# Patient Record
Sex: Female | Born: 1980 | Race: White | Hispanic: No | Marital: Married | Smoking: Current every day smoker
Health system: Southern US, Community
[De-identification: ages and names within clinical notes are randomized; demographics above are authoritative.]

## PROBLEM LIST (undated history)

## (undated) DIAGNOSIS — J329 Chronic sinusitis, unspecified: Secondary | ICD-10-CM

## (undated) DIAGNOSIS — N200 Calculus of kidney: Secondary | ICD-10-CM

## (undated) DIAGNOSIS — N949 Unspecified condition associated with female genital organs and menstrual cycle: Secondary | ICD-10-CM

## (undated) DIAGNOSIS — G43109 Migraine with aura, not intractable, without status migrainosus: Secondary | ICD-10-CM

## (undated) DIAGNOSIS — Z5189 Encounter for other specified aftercare: Secondary | ICD-10-CM

## (undated) DIAGNOSIS — J209 Acute bronchitis, unspecified: Secondary | ICD-10-CM

## (undated) DIAGNOSIS — Z803 Family history of malignant neoplasm of breast: Secondary | ICD-10-CM

## (undated) DIAGNOSIS — Z801 Family history of malignant neoplasm of trachea, bronchus and lung: Secondary | ICD-10-CM

## (undated) DIAGNOSIS — D649 Anemia, unspecified: Secondary | ICD-10-CM

## (undated) DIAGNOSIS — K8020 Calculus of gallbladder without cholecystitis without obstruction: Secondary | ICD-10-CM

## (undated) HISTORY — DX: Chronic sinusitis, unspecified: J32.9

## (undated) HISTORY — DX: Family history of malignant neoplasm of trachea, bronchus and lung: Z80.1

## (undated) HISTORY — DX: Acute bronchitis, unspecified: J20.9

## (undated) HISTORY — DX: Anemia, unspecified: D64.9

## (undated) HISTORY — DX: Migraine with aura, not intractable, without status migrainosus: G43.109

## (undated) HISTORY — DX: Calculus of gallbladder without cholecystitis without obstruction: K80.20

## (undated) HISTORY — DX: Family history of malignant neoplasm of breast: Z80.3

## (undated) HISTORY — DX: Unspecified condition associated with female genital organs and menstrual cycle: N94.9

## (undated) HISTORY — DX: Encounter for other specified aftercare: Z51.89

## (undated) HISTORY — DX: Calculus of kidney: N20.0

---

## 2006-09-22 DIAGNOSIS — G43109 Migraine with aura, not intractable, without status migrainosus: Secondary | ICD-10-CM | POA: Insufficient documentation

## 2006-09-22 DIAGNOSIS — N870 Mild cervical dysplasia: Secondary | ICD-10-CM | POA: Insufficient documentation

## 2006-09-22 DIAGNOSIS — Z720 Tobacco use: Secondary | ICD-10-CM | POA: Insufficient documentation

## 2006-09-22 DIAGNOSIS — G43909 Migraine, unspecified, not intractable, without status migrainosus: Secondary | ICD-10-CM | POA: Insufficient documentation

## 2006-09-22 DIAGNOSIS — D508 Other iron deficiency anemias: Secondary | ICD-10-CM | POA: Insufficient documentation

## 2006-09-22 HISTORY — DX: Migraine with aura, not intractable, without status migrainosus: G43.109

## 2006-09-28 DIAGNOSIS — F17200 Nicotine dependence, unspecified, uncomplicated: Secondary | ICD-10-CM | POA: Insufficient documentation

## 2006-10-10 ENCOUNTER — Emergency Department: Payer: Self-pay | Admitting: Unknown Physician Specialty

## 2006-11-26 DIAGNOSIS — N949 Unspecified condition associated with female genital organs and menstrual cycle: Secondary | ICD-10-CM | POA: Insufficient documentation

## 2006-11-26 HISTORY — DX: Unspecified condition associated with female genital organs and menstrual cycle: N94.9

## 2007-02-04 DIAGNOSIS — H109 Unspecified conjunctivitis: Secondary | ICD-10-CM | POA: Insufficient documentation

## 2007-08-18 ENCOUNTER — Emergency Department: Payer: Self-pay | Admitting: Emergency Medicine

## 2007-09-06 DIAGNOSIS — J209 Acute bronchitis, unspecified: Secondary | ICD-10-CM

## 2007-09-06 HISTORY — DX: Acute bronchitis, unspecified: J20.9

## 2007-10-04 DIAGNOSIS — Z3041 Encounter for surveillance of contraceptive pills: Secondary | ICD-10-CM | POA: Insufficient documentation

## 2008-05-22 DIAGNOSIS — Z0289 Encounter for other administrative examinations: Secondary | ICD-10-CM | POA: Insufficient documentation

## 2008-05-30 DIAGNOSIS — R5381 Other malaise: Secondary | ICD-10-CM | POA: Insufficient documentation

## 2008-05-30 DIAGNOSIS — R5383 Other fatigue: Secondary | ICD-10-CM

## 2008-09-06 DIAGNOSIS — J069 Acute upper respiratory infection, unspecified: Secondary | ICD-10-CM | POA: Insufficient documentation

## 2008-09-07 DIAGNOSIS — N390 Urinary tract infection, site not specified: Secondary | ICD-10-CM | POA: Insufficient documentation

## 2008-12-07 DIAGNOSIS — J329 Chronic sinusitis, unspecified: Secondary | ICD-10-CM | POA: Insufficient documentation

## 2008-12-07 HISTORY — DX: Chronic sinusitis, unspecified: J32.9

## 2008-12-31 ENCOUNTER — Ambulatory Visit: Payer: Self-pay | Admitting: Certified Nurse Midwife

## 2009-08-28 ENCOUNTER — Ambulatory Visit: Payer: Self-pay

## 2009-08-29 ENCOUNTER — Inpatient Hospital Stay: Payer: Self-pay

## 2010-05-31 ENCOUNTER — Emergency Department: Payer: Self-pay | Admitting: Internal Medicine

## 2010-11-30 HISTORY — PX: TUBAL LIGATION: SHX77

## 2011-04-18 ENCOUNTER — Observation Stay: Payer: Self-pay

## 2011-06-02 ENCOUNTER — Ambulatory Visit: Payer: Self-pay

## 2011-06-04 ENCOUNTER — Inpatient Hospital Stay: Payer: Self-pay

## 2011-06-05 LAB — PATHOLOGY REPORT

## 2014-05-08 ENCOUNTER — Emergency Department: Payer: Self-pay | Admitting: Emergency Medicine

## 2014-05-08 LAB — URINALYSIS, COMPLETE
Bilirubin,UR: NEGATIVE
Glucose,UR: NEGATIVE mg/dL (ref 0–75)
Nitrite: POSITIVE
Ph: 7 (ref 4.5–8.0)
Protein: NEGATIVE
RBC,UR: 201 /HPF (ref 0–5)
Specific Gravity: 1.015 (ref 1.003–1.030)
Squamous Epithelial: 1
WBC UR: 9 /HPF (ref 0–5)

## 2014-05-08 LAB — BASIC METABOLIC PANEL
Anion Gap: 11 (ref 7–16)
BUN: 13 mg/dL (ref 7–18)
Calcium, Total: 8.8 mg/dL (ref 8.5–10.1)
Chloride: 109 mmol/L — ABNORMAL HIGH (ref 98–107)
Co2: 22 mmol/L (ref 21–32)
Creatinine: 0.92 mg/dL (ref 0.60–1.30)
EGFR (African American): >60
EGFR (Non-African Amer.): >60
Glucose: 125 mg/dL — ABNORMAL HIGH (ref 65–99)
Osmolality: 285 (ref 275–301)
Potassium: 3.2 mmol/L — ABNORMAL LOW (ref 3.5–5.1)
Sodium: 142 mmol/L (ref 136–145)

## 2014-05-08 LAB — CBC
HCT: 37.1 % (ref 35.0–47.0)
HGB: 12.1 g/dL (ref 12.0–16.0)
MCH: 29.1 pg (ref 26.0–34.0)
MCHC: 32.7 g/dL (ref 32.0–36.0)
MCV: 89 fL (ref 80–100)
Platelet: 459 10*3/uL — ABNORMAL HIGH (ref 150–440)
RBC: 4.17 10*6/uL (ref 3.80–5.20)
RDW: 14.2 % (ref 11.5–14.5)
WBC: 17.7 10*3/uL — ABNORMAL HIGH (ref 3.6–11.0)

## 2014-05-08 LAB — HCG, QUANTITATIVE, PREGNANCY: Beta Hcg, Quant.: <1 m[IU]/mL — ABNORMAL LOW

## 2014-10-29 ENCOUNTER — Emergency Department: Payer: Self-pay | Admitting: Emergency Medicine

## 2014-10-29 LAB — URINALYSIS, COMPLETE
Bilirubin,UR: NEGATIVE
Glucose,UR: NEGATIVE mg/dL (ref 0–75)
Ketone: NEGATIVE
Nitrite: NEGATIVE
Ph: 6 (ref 4.5–8.0)
Protein: NEGATIVE
RBC,UR: 4 /HPF (ref 0–5)
Specific Gravity: 1.011 (ref 1.003–1.030)
Squamous Epithelial: 8
Transitional Epi: <1
WBC UR: 18 /HPF (ref 0–5)

## 2014-10-29 LAB — COMPREHENSIVE METABOLIC PANEL
Albumin: 3.9 g/dL (ref 3.4–5.0)
Alkaline Phosphatase: 95 U/L
Anion Gap: 5 — ABNORMAL LOW (ref 7–16)
BUN: 9 mg/dL (ref 7–18)
Bilirubin,Total: 0.5 mg/dL (ref 0.2–1.0)
Calcium, Total: 8.6 mg/dL (ref 8.5–10.1)
Chloride: 103 mmol/L (ref 98–107)
Co2: 29 mmol/L (ref 21–32)
Creatinine: 0.79 mg/dL (ref 0.60–1.30)
EGFR (African American): >60
EGFR (Non-African Amer.): >60
Glucose: 94 mg/dL (ref 65–99)
Osmolality: 272 (ref 275–301)
Potassium: 3.5 mmol/L (ref 3.5–5.1)
SGOT(AST): 16 U/L (ref 15–37)
SGPT (ALT): 27 U/L
Sodium: 137 mmol/L (ref 136–145)
Total Protein: 7.9 g/dL (ref 6.4–8.2)

## 2014-10-29 LAB — CBC
HCT: 40 % (ref 35.0–47.0)
HGB: 13.2 g/dL (ref 12.0–16.0)
MCH: 30.2 pg (ref 26.0–34.0)
MCHC: 33.1 g/dL (ref 32.0–36.0)
MCV: 91 fL (ref 80–100)
Platelet: 369 10*3/uL (ref 150–440)
RBC: 4.38 10*6/uL (ref 3.80–5.20)
RDW: 13.9 % (ref 11.5–14.5)
WBC: 14.2 10*3/uL — ABNORMAL HIGH (ref 3.6–11.0)

## 2015-07-31 ENCOUNTER — Encounter: Payer: Self-pay | Admitting: Emergency Medicine

## 2015-07-31 ENCOUNTER — Ambulatory Visit
Admission: EM | Admit: 2015-07-31 | Discharge: 2015-07-31 | Disposition: A | Discharge status: Home / Self Care | Payer: Self-pay | Attending: Emergency Medicine | Admitting: Emergency Medicine

## 2015-07-31 ENCOUNTER — Ambulatory Visit: Payer: Self-pay

## 2015-07-31 DIAGNOSIS — J189 Pneumonia, unspecified organism: Secondary | ICD-10-CM

## 2015-07-31 LAB — CBC WITH DIFFERENTIAL/PLATELET
Basophils Absolute: 0.1 10*3/uL (ref 0–0.1)
Basophils Relative: 1 %
Eosinophils Absolute: 0.1 10*3/uL (ref 0–0.7)
Eosinophils Relative: 1 %
HCT: 38.1 % (ref 35.0–47.0)
Hemoglobin: 12.7 g/dL (ref 12.0–16.0)
Lymphocytes Relative: 18 %
Lymphs Abs: 1.7 10*3/uL (ref 1.0–3.6)
MCH: 29.1 pg (ref 26.0–34.0)
MCHC: 33.4 g/dL (ref 32.0–36.0)
MCV: 87.2 fL (ref 80.0–100.0)
Monocytes Absolute: 0.6 10*3/uL (ref 0.2–0.9)
Monocytes Relative: 7 %
Neutro Abs: 7 10*3/uL — ABNORMAL HIGH (ref 1.4–6.5)
Neutrophils Relative %: 73 %
Platelets: 259 10*3/uL (ref 150–440)
RBC: 4.37 MIL/uL (ref 3.80–5.20)
RDW: 13.7 % (ref 11.5–14.5)
WBC: 9.5 10*3/uL (ref 3.6–11.0)

## 2015-07-31 LAB — RAPID INFLUENZA A&B ANTIGENS
Influenza A (ARMC): NOT DETECTED
Influenza B (ARMC): NOT DETECTED

## 2015-07-31 MED ORDER — HYDROCOD POLST-CPM POLST ER 10-8 MG/5ML PO SUER: 5.0000 mL | Freq: Two times a day (BID) | ORAL | Status: DC | PRN

## 2015-07-31 MED ORDER — AZITHROMYCIN 250 MG PO TABS: 250.0000 mg | ORAL_TABLET | Freq: Every day | ORAL | Status: DC

## 2015-07-31 MED ORDER — ALBUTEROL SULFATE HFA 108 (90 BASE) MCG/ACT IN AERS: 2.0000 | INHALATION_SPRAY | RESPIRATORY_TRACT | Status: DC | PRN

## 2015-07-31 MED ORDER — IBUPROFEN 800 MG PO TABS: 800.0000 mg | ORAL_TABLET | Freq: Four times a day (QID) | ORAL | Status: AC | PRN

## 2015-07-31 NOTE — Discharge Instructions (Signed)
2 puffs from your albuterol inhaler every 4-6 hours. Ibuprofen as needed for fever and pain. Finish the azithromycin even if you feel better. Cheratussin for severe cough only. Follow-up with your primary care physician or here if not getting better within 36-48 hours. Go to the ER if you get significantly worse, Chest pain, shortness of breath.

## 2015-07-31 NOTE — ED Provider Notes (Signed)
HPI  SUBJECTIVE:  Heather Harrell is a 34 y.o. female who presents with fevers Tmax 102 for 3 days. She reports body aches, fatigue during this time. She reports cough and rhinorrhea starting today. Symptoms are better with ibuprofen 800 mg every 4 hours, no aggravating factors. She has not tried anything else. She reports mild intermittent headache when febrile and some nausea, but denies neck stiffness, ear pain, sore throat, postnasal drip, wheezing, chest pain, shortness of breath. No abdominal pain. No urinary urgency, frequency, cloudy or odorous urine, hematuria. No rash. No sick contacts. She does not taking medications on a regular basis. No tick bite. Patient has a past medical history of nephrolithiasis, no diabetes, hypertension. LMP 2 weeks ago.  History reviewed. No pertinent past medical history.  Past Surgical History  Procedure Laterality Date  . Cesarean section      History reviewed. No pertinent family history.  Social History  Substance Use Topics  . Smoking status: Current Every Day Smoker  . Smokeless tobacco: None  . Alcohol Use: No    No current facility-administered medications for this encounter.  Current outpatient prescriptions:  .  albuterol (PROVENTIL HFA;VENTOLIN HFA) 108 (90 BASE) MCG/ACT inhaler, Inhale 2 puffs into the lungs every 4 (four) hours as needed for wheezing or shortness of breath. Dispense with aerochamber, Disp: 1 Inhaler, Rfl: 0 .  azithromycin (ZITHROMAX) 250 MG tablet, Take 1 tablet (250 mg total) by mouth daily. 2 tabs po on day 1, 1 tab po on days 2-5, Disp: 6 tablet, Rfl: 0 .  chlorpheniramine-HYDROcodone (TUSSIONEX PENNKINETIC ER) 10-8 MG/5ML SUER, Take 5 mLs by mouth every 12 (twelve) hours as needed for cough., Disp: 120 mL, Rfl: 0 .  ibuprofen (ADVIL,MOTRIN) 800 MG tablet, Take 1 tablet (800 mg total) by mouth every 6 (six) hours as needed., Disp: 30 tablet, Rfl: 0  Allergies  Allergen Reactions  . Codeine Itching      ROS  As noted in HPI.   Physical Exam  BP 121/66 mmHg  Pulse 87  Temp(Src) 98.6 F (37 C) (Oral)  Resp 18  Ht 5\' 2"  (1.575 m)  Wt 173 lb (78.472 kg)  BMI 31.63 kg/m2  SpO2 99%  LMP 07/17/2015  Constitutional: Well developed, well nourished, no acute distress Eyes: PERRL, EOMI, conjunctiva normal bilaterally HENT: Normocephalic, atraumatic,mucus membranes moist TMs normal bilaterally, mild nasal congestion, no sinus tenderness, oropharynx normal Respiratory: Clear to auscultation bilaterally, no rales, no wheezing, no rhonchi Cardiovascular: Normal rate and rhythm, no murmurs, no gallops, no rubs GI: Soft, nondistended, normal bowel sounds, nontender, no rebound, no guarding. No splenomegaly Back: no CVAT skin: No rash, skin intact Lymph, no cervical lymphadenopathy. Musculoskeletal: No edema, no tenderness, no deformities Neurologic: Alert & oriented x 3, CN II-XII grossly intact, no motor deficits, sensation grossly intact Psychiatric: Speech and behavior appropriate   ED Course   Medications - No data to display  Orders Placed This Encounter  Procedures  . Influenza A&B Antigens ([place] only)    Standing Status: Standing     Number of Occurrences: 1     Standing Expiration Date:   . DG Chest 2 View    Standing Status: Standing     Number of Occurrences: 1     Standing Expiration Date:     Order Specific Question:  Reason for Exam (SYMPTOM  OR DIAGNOSIS REQUIRED)    Answer:  cough fever  . CBC with Differential    Standing Status: Standing  Number of Occurrences: 1     Standing Expiration Date:    Results for orders placed or performed during the hospital encounter of 07/31/15 (from the past 24 hour(s))  Influenza A&B Antigens ([facility] only)     Status: None   Collection Time: 07/31/15  6:00 PM  Result Value Ref Range   Influenza A [facility]) NOT DETECTED    Influenza B ([facility]) NOT DETECTED   CBC with Differential     Status: Abnormal   Collection Time:  07/31/15  6:00 PM  Result Value Ref Range   WBC 9.5 3.6 - 11.0 K/uL   RBC 4.37 3.80 - 5.20 MIL/uL   Hemoglobin 12.7 12.0 - 16.0 g/dL   HCT 38.1 35.0 - 47.0 %   MCV 87.2 80.0 - 100.0 fL   MCH 29.1 26.0 - 34.0 pg   MCHC 33.4 32.0 - 36.0 g/dL   RDW 13.7 11.5 - 14.5 %   Platelets 259 150 - 440 K/uL   Neutrophils Relative % 73 %   Neutro Abs 7.0 (H) 1.4 - 6.5 K/uL   Lymphocytes Relative 18 %   Lymphs Abs 1.7 1.0 - 3.6 K/uL   Monocytes Relative 7 %   Monocytes Absolute 0.6 0.2 - 0.9 K/uL   Eosinophils Relative 1 %   Eosinophils Absolute 0.1 0 - 0.7 K/uL   Basophils Relative 1 %   Basophils Absolute 0.1 0 - 0.1 K/uL   Dg Chest 2 View  07/31/2015   CLINICAL DATA:  Nonproductive cough and fever for 4 days.  EXAM: CHEST  2 VIEW  COMPARISON:  None.  FINDINGS: The heart size and mediastinal contours are within normal limits.  Pulmonary infiltrate is seen in the right lower lobe partially silhouetting the and right hemidiaphragm. Left lung remains clear. No evidence of pleural effusion.  IMPRESSION: Right lower lobe infiltrate, consistent with pneumonia. No evidence of pleural effusion.   Electronically Signed   By: Earle Gell M.D.   On: 07/31/2015 18:12    ED Clinical Impression  Community acquired pneumonia  ED Assessment/Plan  Checking flu, chest x-ray due to cough and fever to rule out pneumonia. Also checking a CBC. Patient has no urinary complaints, will not do a UA. LMP was 2 weeks ago. No evidence of otitis, meningitis, pharyngitis, sinusitis, intra-abdominal process, RMSF, Tick borne disease.   Reviewed labs, imaging independently. RLL PNA. Flu negative. No leukocytosis. See radiology report for full details. Albuterol Cheratussin ibuprofen increase fluids azithromycin. Follow up with primary care physician in [facility] or Dr. Princella Ion she has an appointment there in September 19. Or she can come back here if not getting better in 36-48 hours. Discussed labs,  imaging, MDM, plan and followup with patient . Discussed sn/sx that should prompt return to the UC or ED. Patient  agrees with plan.   *This clinic note was created using Dragon dictation software. Therefore, there may be occasional mistakes despite careful proofreading.  ?   Melynda Ripple, MD 07/31/15 [phone]

## 2015-07-31 NOTE — ED Notes (Signed)
Pt with fever bodyaches tired x 2 days

## 2015-11-01 ENCOUNTER — Ambulatory Visit
Admission: EM | Admit: 2015-11-01 | Discharge: 2015-11-01 | Disposition: A | Discharge status: Home / Self Care | Payer: BLUE CROSS/BLUE SHIELD | Attending: Family Medicine | Admitting: Family Medicine

## 2015-11-01 ENCOUNTER — Encounter: Payer: Self-pay | Admitting: *Deleted

## 2015-11-01 DIAGNOSIS — N390 Urinary tract infection, site not specified: Secondary | ICD-10-CM

## 2015-11-01 LAB — URINALYSIS COMPLETE WITH MICROSCOPIC (ARMC ONLY)
Bilirubin Urine: NEGATIVE
Glucose, UA: NEGATIVE mg/dL
Ketones, ur: NEGATIVE mg/dL
Leukocytes, UA: NEGATIVE
Nitrite: NEGATIVE
Protein, ur: NEGATIVE mg/dL
Specific Gravity, Urine: 1.015 (ref 1.005–1.030)
pH: 7 (ref 5.0–8.0)

## 2015-11-01 LAB — PREGNANCY, URINE: Preg Test, Ur: NEGATIVE

## 2015-11-01 MED ORDER — NITROFURANTOIN MONOHYD MACRO 100 MG PO CAPS: 100.0000 mg | ORAL_CAPSULE | Freq: Two times a day (BID) | ORAL | Status: DC

## 2015-11-01 NOTE — Discharge Instructions (Signed)

## 2015-11-01 NOTE — ED Provider Notes (Signed)
[facility]  ____________________________________________  Time seen: Approximately 1:32 PM  I have reviewed the triage vital signs and the nursing notes.   HISTORY  Chief Complaint Urinary Tract Infection   HPI Heather Harrell is a 34 y.o. female presents for complaints of two days of low back aching pain as well as urinary frequency and burning with urination. States low back pain is mild and aching at 3/10. Denies pain changes with position changes. Denies pain radiation. States feels like she frequently needs to urinate. Denies vaginal bleeding, vaginal discharge, or vaginal complaints. Denies recent changes in sexual partners.   Reports history of UTIs with last being one year ago, and reports feels same. Denies recent antibiotic use. Denies nausea, vomiting, diarrhea, or fever.    LMP: 2 weeks ago, denies chance of pregnancy.   History reviewed. No pertinent past medical history.  There are no active problems to display for this patient.   Past Surgical History  Procedure Laterality Date  . Cesarean section      Current Outpatient Rx  Name  Route  Sig  Dispense  Refill  .           .           .           .             Allergies Ivp dye and Codeine  History reviewed. No pertinent family history.  Social History Social History  Substance Use Topics  . Smoking status: Current Every Day Smoker  . Smokeless tobacco: Never Used  . Alcohol Use: No    Review of Systems Constitutional: No fever/chills Eyes: No visual changes. ENT: No sore throat. Cardiovascular: Denies chest pain. Respiratory: Denies shortness of breath. Gastrointestinal: No abdominal pain.  No nausea, no vomiting.  No diarrhea.  No constipation. Genitourinary: positive for dysuria. Musculoskeletal: Negative for back pain. Skin: Negative for rash. Neurological: Negative for headaches, focal weakness or numbness.  10-point ROS otherwise  negative.  ____________________________________________   PHYSICAL EXAM:  VITAL SIGNS: ED Triage Vitals  Enc Vitals Group     BP 11/01/15 1251 124/80 mmHg     Pulse Rate 11/01/15 1251 95     Resp 11/01/15 1251 18     Temp 11/01/15 1251 98.4 F (36.9 C)     Temp Source 11/01/15 1251 Oral     SpO2 11/01/15 1251 100 %     Weight 11/01/15 1251 169 lb (76.658 kg)     Height 11/01/15 1251 5\' 3"  (1.6 m)     Head Cir --      Peak Flow --      Pain Score 11/01/15 1255 5     Pain Loc --      Pain Edu? --      Excl. in [place]? --     Constitutional: Alert and oriented. Well appearing and in no acute distress. Eyes: Conjunctivae are normal. PERRL. EOMI. Head: Atraumatic.  Nose: No congestion/rhinnorhea.  Mouth/Throat: Mucous membranes are moist.  Oropharynx non-erythematous. Neck: No stridor.  No cervical spine tenderness to palpation. Hematological/Lymphatic/Immunilogical: No cervical lymphadenopathy. Cardiovascular: Normal rate, regular rhythm. Grossly normal heart sounds.  Good peripheral circulation. Respiratory: Normal respiratory effort.  No retractions. Lungs CTAB.No wheezes, rales or rhonchi.  Gastrointestinal: Soft and nontender. No distention. Normal Bowel sounds. No CVA tenderness. Musculoskeletal: No lower or upper extremity tenderness nor edema.  Neurologic:  Normal speech and language. No gross focal neurologic deficits are  appreciated. No gait instability. Skin:  Skin is warm, dry and intact. No rash noted. Psychiatric: Mood and affect are normal. Speech and behavior are normal.  ____________________________________________   LABS (all labs ordered are listed, but only abnormal results are displayed)  Labs Reviewed  URINALYSIS COMPLETEWITH MICROSCOPIC ([place]) - Abnormal; Notable for the following:    Hgb urine dipstick 1+ (*)    Bacteria, UA FEW (*)    Squamous Epithelial / LPF 0-5 (*)    All other components within normal limits  PREGNANCY, URINE      INITIAL IMPRESSION / ASSESSMENT AND PLAN / ED COURSE  Pertinent labs & imaging results that were available during my care of the patient were reviewed by me and considered in my medical decision making (see chart for details).  Well appearing, no acute distress. Dysuria x 2 days. Few bacteria present, with 1+hgb, will culture. And treat UTI with oral macrobid. Supportive treatments, rest, fluids and PCP follow up.   Discussed follow up with Primary care physician this week. Discussed follow up and return parameters including no resolution or any worsening concerns. Patient verbalized understanding and agreed to plan.   ____________________________________________   FINAL CLINICAL IMPRESSION(S) / ED DIAGNOSES  Final diagnoses:  UTI (lower urinary tract infection)       Marylene Land, NP 11/01/15 1705

## 2015-11-01 NOTE — ED Notes (Signed)
Patient started having lower back pain radiating to the front pelvic region. Patient does not report dark urine with odor. Patient does have a history of kidney stones and had her last  UTI 05/2014.

## 2016-02-25 ENCOUNTER — Emergency Department
Admission: EM | Admit: 2016-02-25 | Discharge: 2016-02-25 | Disposition: A | Discharge status: Home / Self Care | Payer: Self-pay | Attending: Emergency Medicine | Admitting: Emergency Medicine

## 2016-02-25 ENCOUNTER — Emergency Department: Payer: Self-pay

## 2016-02-25 DIAGNOSIS — Z79899 Other long term (current) drug therapy: Secondary | ICD-10-CM | POA: Insufficient documentation

## 2016-02-25 DIAGNOSIS — K8020 Calculus of gallbladder without cholecystitis without obstruction: Secondary | ICD-10-CM | POA: Insufficient documentation

## 2016-02-25 DIAGNOSIS — F17210 Nicotine dependence, cigarettes, uncomplicated: Secondary | ICD-10-CM | POA: Insufficient documentation

## 2016-02-25 DIAGNOSIS — J329 Chronic sinusitis, unspecified: Secondary | ICD-10-CM | POA: Insufficient documentation

## 2016-02-25 DIAGNOSIS — N200 Calculus of kidney: Secondary | ICD-10-CM | POA: Insufficient documentation

## 2016-02-25 LAB — URINALYSIS COMPLETE WITH MICROSCOPIC (ARMC ONLY)
Bilirubin Urine: NEGATIVE
Glucose, UA: NEGATIVE mg/dL
Ketones, ur: NEGATIVE mg/dL
Leukocytes, UA: NEGATIVE
Nitrite: NEGATIVE
Protein, ur: 30 mg/dL — AB
Specific Gravity, Urine: 1.016 (ref 1.005–1.030)
pH: 8 (ref 5.0–8.0)

## 2016-02-25 LAB — PREGNANCY, URINE: Preg Test, Ur: NEGATIVE

## 2016-02-25 MED ORDER — ONDANSETRON 4 MG PO TBDP: 4.0000 mg | ORAL_TABLET | Freq: Three times a day (TID) | ORAL | Status: DC | PRN

## 2016-02-25 MED ORDER — SODIUM CHLORIDE 0.9 % IV SOLN
Freq: Once | INTRAVENOUS | Status: AC
  Administered 2016-02-25: 06:00:00 via INTRAVENOUS

## 2016-02-25 MED ORDER — ONDANSETRON HCL 4 MG/2ML IJ SOLN
INTRAMUSCULAR | Status: AC
  Filled 2016-02-25: qty 2

## 2016-02-25 MED ORDER — ONDANSETRON HCL 4 MG/2ML IJ SOLN
4.0000 mg | Freq: Once | INTRAMUSCULAR | Status: AC
  Administered 2016-02-25: 4 mg via INTRAVENOUS

## 2016-02-25 MED ORDER — OXYCODONE-ACETAMINOPHEN 5-325 MG PO TABS
2.0000 | ORAL_TABLET | Freq: Once | ORAL | Status: AC
  Administered 2016-02-25: 2 via ORAL

## 2016-02-25 MED ORDER — OXYCODONE-ACETAMINOPHEN 5-325 MG PO TABS
ORAL_TABLET | ORAL | Status: AC
  Filled 2016-02-25: qty 2

## 2016-02-25 MED ORDER — ONDANSETRON HCL 4 MG/2ML IJ SOLN
INTRAMUSCULAR | Status: AC
  Administered 2016-02-25: 4 mg
  Filled 2016-02-25: qty 2

## 2016-02-25 MED ORDER — MORPHINE SULFATE (PF) 4 MG/ML IV SOLN
INTRAVENOUS | Status: AC
  Administered 2016-02-25: 4 mg
  Filled 2016-02-25: qty 1

## 2016-02-25 MED ORDER — OXYCODONE-ACETAMINOPHEN 5-325 MG PO TABS: 1.0000 | ORAL_TABLET | ORAL | Status: DC | PRN

## 2016-02-25 NOTE — Discharge Instructions (Signed)
Cholelithiasis °Cholelithiasis (also called gallstones) is a form of gallbladder disease in which gallstones form in your gallbladder. The gallbladder is an organ that stores bile made in the liver, which helps digest fats. Gallstones begin as small crystals and slowly grow into stones. Gallstone pain occurs when the gallbladder spasms and a gallstone is blocking the duct. Pain can also occur when a stone passes out of the duct.  °RISK FACTORS °· Being female.   °· Having multiple pregnancies. Health care providers sometimes advise removing diseased gallbladders before future pregnancies.   °· Being obese. °· Eating a diet heavy in fried foods and fat.   °· Being older than 60 years and increasing age.   °· Prolonged use of medicines containing female hormones.   °· Having diabetes mellitus.   °· Rapidly losing weight.   °· Having a family history of gallstones (heredity).   °SYMPTOMS °· Nausea.   °· Vomiting. °· Abdominal pain.   °· Yellowing of the skin (jaundice).   °· Sudden pain. It may persist from several minutes to several hours. °· Fever.   °· Tenderness to the touch.  °In some cases, when gallstones do not move into the bile duct, people have no pain or symptoms. These are called "silent" gallstones.  °TREATMENT °Silent gallstones do not need treatment. In severe cases, emergency surgery may be required. Options for treatment include: °· Surgery to remove the gallbladder. This is the most common treatment. °· Medicines. These do not always work and may take 6-12 months or more to work. °· Shock wave treatment (extracorporeal biliary lithotripsy). In this treatment an ultrasound machine sends shock waves to the gallbladder to break gallstones into smaller pieces that can pass into the intestines or be dissolved by medicine. °HOME CARE INSTRUCTIONS  °· Only take over-the-counter or prescription medicines for pain, discomfort, or fever as directed by your health care provider.   °· Follow a low-fat diet until  seen again by your health care provider. Fat causes the gallbladder to contract, which can result in pain.   °· Follow up with your health care provider as directed. Attacks are almost always recurrent and surgery is usually required for permanent treatment.   °SEEK IMMEDIATE MEDICAL CARE IF:  °· Your pain increases and is not controlled by medicines.   °· You have a fever or persistent symptoms for more than 2-3 days.   °· You have a fever and your symptoms suddenly get worse.   °· You have persistent nausea and vomiting.   °MAKE SURE YOU:  °· Understand these instructions. °· Will watch your condition. °· Will get help right away if you are not doing well or get worse. °  °This information is not intended to replace advice given to you by your health care provider. Make sure you discuss any questions you have with your health care provider. °  °Document Released: 11/12/2005 Document Revised: 07/19/2013 Document Reviewed: 05/10/2013 °Elsevier Interactive Patient Education ©2016 [employer] ° °Kidney Stones °Kidney stones (urolithiasis) are deposits that form inside your kidneys. The intense pain is caused by the stone moving through the urinary tract. When the stone moves, the ureter goes into spasm around the stone. The stone is usually passed in the urine.  °CAUSES  °· A disorder that makes certain neck glands produce too much parathyroid hormone (primary hyperparathyroidism). °· A buildup of uric acid crystals, similar to gout in your joints. °· Narrowing (stricture) of the ureter. °· A kidney obstruction present at birth (congenital obstruction). °· Previous surgery on the kidney or ureters. °· Numerous kidney infections. °SYMPTOMS  °· Feeling sick   to your stomach (nauseous). °· Throwing up (vomiting). °· Blood in the urine (hematuria). °· Pain that usually spreads (radiates) to the groin. °· Frequency or urgency of urination. °DIAGNOSIS  °· Taking a history and physical exam. °· Blood or urine tests. °· CT  scan. °· Occasionally, an examination of the inside of the urinary bladder (cystoscopy) is performed. °TREATMENT  °· Observation. °· Increasing your fluid intake. °· Extracorporeal shock wave lithotripsy--This is a noninvasive procedure that uses shock waves to break up kidney stones. °· Surgery may be needed if you have severe pain or persistent obstruction. There are various surgical procedures. Most of the procedures are performed with the use of small instruments. Only small incisions are needed to accommodate these instruments, so recovery time is minimized. °The size, location, and chemical composition are all important variables that will determine the proper choice of action for you. Talk to your health care provider to better understand your situation so that you will minimize the risk of injury to yourself and your kidney.  °HOME CARE INSTRUCTIONS  °· Drink enough water and fluids to keep your urine clear or pale yellow. This will help you to pass the stone or stone fragments. °· Strain all urine through the provided strainer. Keep all particulate matter and stones for your health care provider to see. The stone causing the pain may be as small as a grain of salt. It is very important to use the strainer each and every time you pass your urine. The collection of your stone will allow your health care provider to analyze it and verify that a stone has actually passed. The stone analysis will often identify what you can do to reduce the incidence of recurrences. °· Only take over-the-counter or prescription medicines for pain, discomfort, or fever as directed by your health care provider. °· Keep all follow-up visits as told by your health care provider. This is important. °· Get follow-up X-rays if required. The absence of pain does not always mean that the stone has passed. It may have only stopped moving. If the urine remains completely obstructed, it can cause loss of kidney function or even complete  destruction of the kidney. It is your responsibility to make sure X-rays and follow-ups are completed. Ultrasounds of the kidney can show blockages and the status of the kidney. Ultrasounds are not associated with any radiation and can be performed easily in a matter of minutes. °· Make changes to your daily diet as told by your health care provider. You may be told to: °¨ Limit the amount of salt that you eat. °¨ Eat 5 or more servings of fruits and vegetables each day. °¨ Limit the amount of meat, poultry, fish, and eggs that you eat. °· Collect a 24-hour urine sample as told by your health care provider. You may need to collect another urine sample every 6-12 months. °SEEK MEDICAL CARE IF: °· You experience pain that is progressive and unresponsive to any pain medicine you have been prescribed. °SEEK IMMEDIATE MEDICAL CARE IF:  °· Pain cannot be controlled with the prescribed medicine. °· You have a fever or shaking chills. °· The severity or intensity of pain increases over 18 hours and is not relieved by pain medicine. °· You develop a new onset of abdominal pain. °· You feel faint or pass out. °· You are unable to urinate. °  °This information is not intended to replace advice given to you by your health care provider. Make sure you discuss   any questions you have with your health care provider. °  °Document Released: 11/16/2005 Document Revised: 08/07/2015 Document Reviewed: 04/19/2013 °Elsevier Interactive Patient Education ©2016 [employer] Inc[employer] ° °

## 2016-02-25 NOTE — ED Notes (Signed)
Pt reports that she was seen in June of last year for a "kidney stone" on the right side and that this pain feels the same except on the left side - She feels like she has to void constantly and the pain radiates from left lower back into left lower abd

## 2016-02-25 NOTE — ED Provider Notes (Signed)
[facility] Emergency Department Provider Note  ____________________________________________  Time seen: 5:30 AM I have reviewed the triage vital signs and the nursing notes.   HISTORY  Chief Complaint Flank Pain     HPI Heather Harrell is a 35 y.o. female presents with 10 out of 10 left flank pain with acute onset at midnight. Patient admits to a history of kidney stone in the past on her right side in 2015. Patient admits to nonbloody emesis. Patient denies any fever. She denies any aggravating or alleviating factors.     Past medical history Kidney stone There are no active problems to display for this patient.   Past Surgical History  Procedure Laterality Date  . Cesarean section      Current Outpatient Rx  Name  Route  Sig  Dispense  Refill  . albuterol (PROVENTIL HFA;VENTOLIN HFA) 108 (90 BASE) MCG/ACT inhaler   Inhalation   Inhale 2 puffs into the lungs every 4 (four) hours as needed for wheezing or shortness of breath. Dispense with aerochamber   1 Inhaler   0   . azithromycin (ZITHROMAX) 250 MG tablet   Oral   Take 1 tablet (250 mg total) by mouth daily. 2 tabs po on day 1, 1 tab po on days 2-5   6 tablet   0   . chlorpheniramine-HYDROcodone (TUSSIONEX PENNKINETIC ER) 10-8 MG/5ML SUER   Oral   Take 5 mLs by mouth every 12 (twelve) hours as needed for cough.   120 mL   0   . ibuprofen (ADVIL,MOTRIN) 800 MG tablet   Oral   Take 1 tablet (800 mg total) by mouth every 6 (six) hours as needed.   30 tablet   0   . nitrofurantoin, macrocrystal-monohydrate, (MACROBID) 100 MG capsule   Oral   Take 1 capsule (100 mg total) by mouth 2 (two) times daily.   10 capsule   0     Allergies Ivp dye  No family history on file.  Social History Social History  Substance Use Topics  . Smoking status: Current Every Day Smoker  . Smokeless tobacco: Never Used  . Alcohol Use: No    Review of Systems  Constitutional: Negative  for fever. Eyes: Negative for visual changes. ENT: Negative for sore throat. Cardiovascular: Negative for chest pain. Respiratory: Negative for shortness of breath. Gastrointestinal: Positive for left flank pain and vomiting Genitourinary: Negative for dysuria. Musculoskeletal: Negative for back pain. Skin: Negative for rash. Neurological: Negative for headaches, focal weakness or numbness.   10-point ROS otherwise negative.  ____________________________________________   PHYSICAL EXAM:  VITAL SIGNS: ED Triage Vitals  Enc Vitals Group     BP 02/25/16 0500 124/84 mmHg     Pulse Rate 02/25/16 0500 83     Resp 02/25/16 0500 18     Temp 02/25/16 0500 97.7 F (36.5 C)     Temp Source 02/25/16 0500 Oral     SpO2 02/25/16 0500 100 %     Weight 02/25/16 0500 168 lb (76.204 kg)     Height 02/25/16 0500 5\' 3"  (1.6 m)     Head Cir --      Peak Flow --      Pain Score 02/25/16 0501 10     Pain Loc --      Pain Edu? --      Excl. in [place]? --     Constitutional: Alert and oriented. Apparent discomfort Eyes: Conjunctivae are normal. PERRL. Normal extraocular movements.  ENT   Head: Normocephalic and atraumatic.   Nose: No congestion/rhinnorhea.   Mouth/Throat: Mucous membranes are moist.   Neck: No stridor. Hematological/Lymphatic/Immunilogical: No cervical lymphadenopathy. Cardiovascular: Normal rate, regular rhythm. Normal and symmetric distal pulses are present in all extremities. No murmurs, rubs, or gallops. Respiratory: Normal respiratory effort without tachypnea nor retractions. Breath sounds are clear and equal bilaterally. No wheezes/rales/rhonchi. Gastrointestinal: Soft and nontender. No distention. There is no CVA tenderness. Genitourinary: deferred Musculoskeletal: Nontender with normal range of motion in all extremities. No joint effusions.  No lower extremity tenderness nor edema. Neurologic:  Normal speech and language. No gross focal neurologic deficits  are appreciated. Speech is normal.  Skin:  Skin is warm, dry and intact. No rash noted. Psychiatric: Mood and affect are normal. Speech and behavior are normal. Patient exhibits appropriate insight and judgment.  ____________________________________________    LABS (pertinent positives/negatives)  Labs Reviewed  URINALYSIS COMPLETEWITH MICROSCOPIC ([place] ONLY) - Abnormal; Notable for the following:    Color, Urine YELLOW (*)    APPearance HAZY (*)    Hgb urine dipstick 3+ (*)    Protein, ur 30 (*)    Bacteria, UA RARE (*)    Squamous Epithelial / LPF 6-30 (*)    All other components within normal limits  PREGNANCY, URINE         INITIAL IMPRESSION / ASSESSMENT AND PLAN / ED COURSE  Pertinent labs & imaging results that were available during my care of the patient were reviewed by me and considered in my medical decision making (see chart for details).  Patient received IV morphine 4 mg and Zofran for analgesia and antiemetic respectively.  ____________________________________________   FINAL CLINICAL IMPRESSION(S) / ED DIAGNOSES  Final diagnoses:  Kidney stone on left side  Gallstone      Gregor Hams, MD 02/28/16 [phone]

## 2016-02-25 NOTE — ED Notes (Signed)
Pt in with co left flank pain since 0000 hx of kidney stones.  Has had vomiting and urinary frequency.

## 2016-02-26 ENCOUNTER — Encounter: Payer: Self-pay | Admitting: Urology

## 2016-02-26 ENCOUNTER — Other Ambulatory Visit: Payer: Self-pay

## 2016-02-26 ENCOUNTER — Ambulatory Visit (INDEPENDENT_AMBULATORY_CARE_PROVIDER_SITE_OTHER): Payer: BLUE CROSS/BLUE SHIELD | Admitting: Urology

## 2016-02-26 VITALS — BP 107/73 | HR 105 | Temp 98.5°F | Ht 63.0 in | Wt 171.8 lb

## 2016-02-26 DIAGNOSIS — IMO0001 Reserved for inherently not codable concepts without codable children: Secondary | ICD-10-CM | POA: Insufficient documentation

## 2016-02-26 DIAGNOSIS — G4733 Obstructive sleep apnea (adult) (pediatric): Secondary | ICD-10-CM | POA: Insufficient documentation

## 2016-02-26 DIAGNOSIS — N200 Calculus of kidney: Secondary | ICD-10-CM | POA: Insufficient documentation

## 2016-02-26 DIAGNOSIS — K8020 Calculus of gallbladder without cholecystitis without obstruction: Secondary | ICD-10-CM | POA: Insufficient documentation

## 2016-02-26 DIAGNOSIS — N390 Urinary tract infection, site not specified: Secondary | ICD-10-CM | POA: Insufficient documentation

## 2016-02-26 HISTORY — DX: Calculus of gallbladder without cholecystitis without obstruction: K80.20

## 2016-02-26 HISTORY — DX: Calculus of kidney: N20.0

## 2016-02-26 LAB — URINALYSIS, COMPLETE
Bilirubin, UA: NEGATIVE
Glucose, UA: NEGATIVE
Nitrite, UA: POSITIVE — AB
Specific Gravity, UA: 1.025 (ref 1.005–1.030)
Urobilinogen, Ur: 0.2 mg/dL (ref 0.2–1.0)
pH, UA: 5.5 (ref 5.0–7.5)

## 2016-02-26 LAB — MICROSCOPIC EXAMINATION
RBC, UA: >30 /hpf — ABNORMAL HIGH (ref 0–?)
WBC, UA: >30 /hpf — ABNORMAL HIGH (ref 0–?)

## 2016-02-26 MED ORDER — CIPROFLOXACIN HCL 500 MG PO TABS: 500.0000 mg | ORAL_TABLET | Freq: Two times a day (BID) | ORAL | Status: DC

## 2016-02-26 NOTE — Progress Notes (Signed)
02/26/2016 1:43 PM   ASHLEYN AGNE 1980/12/02 [id number]  Referring provider: [address] [place], [place] [address]  Chief Complaint  Patient presents with  . New Patient (Initial Visit)    renal stones    HPI: The patient is a 35 year old female with a distal left 5 mm stone at the UVJ.  Upon her arrival to our office today, she passed her stone while giving a urine specimen. She's had nausea and vomiting as well as pain. She was afebrile until earlier this morning when she's by Dr. temperature 102. Her urinalysis is concerning for infection. On review of her CT scan, she only had the 1 stone at the distal UVJ. She has some small calcifications throughout her bilateral kidneys but no other definitive stones. This is her second episode of nephrolithiasis. She passed a stone spontaneously in 2015.   PMH: Past Medical History  Diagnosis Date  . Acute onset aura migraine 09/22/2006  . Acute bronchitis 09/06/2007  . Chronic infection of sinus 12/07/2008  . Biliary calculi 02/26/2016  . Calculus of kidney 02/26/2016  . Female genital symptoms 11/26/2006    ASSESSMENT: Assume UTI as etiology of Sx. Pain in LLQ is less today. If pain persist or does not improve on antibiotic, will proceed with pelvic US to R/O ovarian cyst. Instructed pt that if pain fot worse or she developed fever to report to ER. Pt verb understanding.     Surgical History: Past Surgical History  Procedure Laterality Date  . Cesarean section      Home Medications:    Medication List       This list is accurate as of: 02/26/16  1:43 PM.  Always use your most recent med list.               albuterol 108 (90 Base) MCG/ACT inhaler  Commonly known as:  PROVENTIL HFA;VENTOLIN HFA  Inhale 2 puffs into the lungs every 4 (four) hours as needed for wheezing or shortness of breath. Dispense with aerochamber     azithromycin 250 MG tablet  Commonly known as:   ZITHROMAX  Take 1 tablet (250 mg total) by mouth daily. 2 tabs po on day 1, 1 tab po on days 2-5     chlorpheniramine-HYDROcodone 10-8 MG/5ML Suer  Commonly known as:  TUSSIONEX PENNKINETIC ER  Take 5 mLs by mouth every 12 (twelve) hours as needed for cough.     ciprofloxacin 500 MG tablet  Commonly known as:  CIPRO  Take by mouth. Reported on 02/26/2016     ciprofloxacin 500 MG tablet  Commonly known as:  CIPRO  Take 1 tablet (500 mg total) by mouth every 12 (twelve) hours.     citalopram 20 MG tablet  Commonly known as:  CELEXA  Take 20 mg by mouth daily.     ibuprofen 800 MG tablet  Commonly known as:  ADVIL,MOTRIN  Take 1 tablet (800 mg total) by mouth every 6 (six) hours as needed.     nitrofurantoin (macrocrystal-monohydrate) 100 MG capsule  Commonly known as:  MACROBID  Take 1 capsule (100 mg total) by mouth 2 (two) times daily.     ondansetron 4 MG disintegrating tablet  Commonly known as:  ZOFRAN ODT  Take 1 tablet (4 mg total) by mouth every 8 (eight) hours as needed for nausea or vomiting.     oxyCODONE-acetaminophen 5-325 MG tablet  Commonly known as:  PERCOCET/ROXICET  Take by mouth.  oxyCODONE-acetaminophen 5-325 MG tablet  Commonly known as:  ROXICET  Take 1 tablet by mouth every 4 (four) hours as needed for severe pain.        Allergies:  Allergies  Allergen Reactions  . Ivp Dye [Iodinated Diagnostic Agents] Itching  . Codeine Itching and Hives    Family History: No family history on file.  Social History:  reports that she has been smoking Cigarettes.  She has been smoking about 1.00 pack per day. She has never used smokeless tobacco. She reports that she does not drink alcohol or use illicit drugs.  ROS: UROLOGY Frequent Urination?: Yes Hard to postpone urination?: No Burning/pain with urination?: Yes Get up at night to urinate?: Yes Leakage of urine?: No Urine stream starts and stops?: No Trouble starting stream?: No Do you have to  strain to urinate?: No Blood in urine?: Yes Urinary tract infection?: No Sexually transmitted disease?: No Injury to kidneys or bladder?: No Painful intercourse?: No Weak stream?: No Currently pregnant?: No Vaginal bleeding?: No Last menstrual period?: 02/19/16                                      Physical Exam: BP 107/73 mmHg  Pulse 105  Temp(Src) 98.5 F (36.9 C) (Oral)  Ht 5\' 3"  (1.6 m)  Wt 171 lb 12.8 oz (77.928 kg)  BMI 30.44 kg/m2  LMP 02/19/2016  Constitutional:  Alert and oriented, No acute distress. HEENT: [place] AT, moist mucus membranes.  Trachea midline, no masses. Cardiovascular: No clubbing, cyanosis, or edema. Respiratory: Normal respiratory effort, no increased work of breathing. GI: Abdomen is soft, nontender, nondistended, no abdominal masses GU: Mild left CVA tenderness Skin: No rashes, bruises or suspicious lesions. Lymph: No cervical or inguinal adenopathy. Neurologic: Grossly intact, no focal deficits, moving all 4 extremities. Psychiatric: Normal mood and affect.  Laboratory Data: Lab Results  Component Value Date   WBC 9.5 07/31/2015   HGB 12.7 07/31/2015   HCT 38.1 07/31/2015   MCV 87.2 07/31/2015   PLT 259 07/31/2015    Lab Results  Component Value Date   CREATININE 0.79 10/29/2014    No results found for: PSA  No results found for: TESTOSTERONE  No results found for: HGBA1C  Urinalysis    Component Value Date/Time   COLORURINE YELLOW* 02/25/2016 0505   COLORURINE Yellow 10/29/2014 1702   APPEARANCEUR HAZY* 02/25/2016 0505   APPEARANCEUR Cloudy 10/29/2014 1702   LABSPEC 1.016 02/25/2016 0505   LABSPEC 1.011 10/29/2014 1702   PHURINE 8.0 02/25/2016 0505   PHURINE 6.0 10/29/2014 1702   GLUCOSEU NEGATIVE 02/25/2016 0505   GLUCOSEU Negative 10/29/2014 1702   HGBUR 3+* 02/25/2016 0505   HGBUR 3+ 10/29/2014 1702   BILIRUBINUR NEGATIVE 02/25/2016 0505   BILIRUBINUR Negative 10/29/2014 1702   KETONESUR NEGATIVE  02/25/2016 0505   KETONESUR Negative 10/29/2014 1702   PROTEINUR 30* 02/25/2016 0505   PROTEINUR Negative 10/29/2014 1702   NITRITE NEGATIVE 02/25/2016 0505   NITRITE Negative 10/29/2014 1702   LEUKOCYTESUR NEGATIVE 02/25/2016 0505   LEUKOCYTESUR Trace 10/29/2014 1702    Pertinent Imaging: CLINICAL DATA: Acute onset of increased urinary frequency. Vomiting and left flank pain. Initial encounter.  EXAM: CT ABDOMEN AND PELVIS WITHOUT CONTRAST  TECHNIQUE: Multidetector CT imaging of the abdomen and pelvis was performed following the standard protocol without IV contrast.  COMPARISON: CT of the abdomen and pelvis from 10/29/2014  FINDINGS: The visualized  lung bases are clear. A small to moderate hiatal hernia is noted.  The liver and spleen are unremarkable in appearance. Prominent stones are noted within the gallbladder. The gallbladder is otherwise unremarkable. The pancreas and adrenal glands are unremarkable.  There is mild left-sided hydronephrosis, with left-sided perinephric stranding and fluid, and an obstructing 5 x 4 mm stone noted distally at the left vesicoureteral junction. Scattered nonobstructing bilateral renal stones are seen. The right kidney is otherwise unremarkable.  No free fluid is identified. The small bowel is unremarkable in appearance. The stomach is within normal limits. No acute vascular abnormalities are seen.  The appendix is normal in caliber, without evidence of appendicitis. The colon is largely decompressed and is unremarkable in appearance.  The bladder is mildly distended and grossly remarkable. The uterus is unremarkable in appearance. No suspicious adnexal masses are seen. The ovaries are relatively symmetric in appearance. No inguinal lymphadenopathy is seen.  No acute osseous abnormalities are identified.  IMPRESSION: 1. Mild left-sided hydronephrosis, with an obstructing 5 x 4 mm stone noted distally at the left  vesicoureteral junction. 2. Scattered nonobstructing bilateral renal stones seen. 3. Cholelithiasis. Gallbladder otherwise unremarkable. 4. Small to moderate hiatal hernia noted.  Assessment & Plan:    1. Distal left UVJ stone Fortunately, the patient passed her stone will the office today as she has had a fever and her urinalysis was concerning for infection. We will start her on antibiotics for a urinary tract infection. Cipro 500 mg twice a day was called into her pharmacy for 1 week. In regards to her recently passed stone, the patient does not want to pay for stone analysis and watched the stone home today. We will let her know the results of her urine culture. She'll follow-up with [place] as needed.   Return if symptoms worsen or fail to improve.  Nickie Retort, MD  [facility] [address], [place], [place] [address] [phone]

## 2016-02-28 ENCOUNTER — Telehealth: Payer: Self-pay

## 2016-02-28 DIAGNOSIS — N12 Tubulo-interstitial nephritis, not specified as acute or chronic: Secondary | ICD-10-CM

## 2016-02-28 LAB — CULTURE, URINE COMPREHENSIVE

## 2016-02-28 MED ORDER — SULFAMETHOXAZOLE-TRIMETHOPRIM 800-160 MG PO TABS: 1.0000 | ORAL_TABLET | Freq: Two times a day (BID) | ORAL | Status: AC

## 2016-02-28 NOTE — Telephone Encounter (Signed)
Pt called stating she has been taking the cipro as rx but is not seeing any difference in her urinary symptoms or fever. Pt states that she has been taking ibuprofen but continues to run a 99.9-102 fever. Pt also states that she continues to burn on urination and have pain on her sides. Please advise.

## 2016-02-28 NOTE — Telephone Encounter (Signed)
Pt called back stating it was about time for her temperature to jump back up and she was concerned. Per Dr. Alyson Ingles pt should take bactrim ds bid x14 days. Dr. Alyson Ingles stated pt temp should start to decrease but if it does not she should go straight to the ER. Pt voiced understanding. Medication was sent to pharmacy.

## 2016-07-22 ENCOUNTER — Ambulatory Visit: Payer: Self-pay

## 2016-07-29 ENCOUNTER — Ambulatory Visit: Payer: BLUE CROSS/BLUE SHIELD

## 2016-09-07 ENCOUNTER — Ambulatory Visit: Payer: Self-pay | Attending: Oncology | Admitting: *Deleted

## 2016-09-07 ENCOUNTER — Ambulatory Visit
Admission: RE | Admit: 2016-09-07 | Discharge: 2016-09-07 | Disposition: A | Discharge status: Home / Self Care | Payer: Self-pay | Source: Ambulatory Visit | Attending: Oncology | Admitting: Oncology

## 2016-09-07 ENCOUNTER — Encounter (INDEPENDENT_AMBULATORY_CARE_PROVIDER_SITE_OTHER): Payer: Self-pay

## 2016-09-07 VITALS — BP 123/86 | HR 99 | Temp 98.6°F | Ht 62.6 in | Wt 179.5 lb

## 2016-09-07 DIAGNOSIS — Z0000 Encounter for general adult medical examination without abnormal findings: Secondary | ICD-10-CM

## 2016-09-07 NOTE — Progress Notes (Signed)
Subjective:     Patient ID: Kerby Moors, female   DOB: Jan 11, 1981, 35 y.o.   MRN: [id number]  HPI   Review of Systems     Objective:   Physical Exam  Pulmonary/Chest: Right breast exhibits no inverted nipple, no mass, no nipple discharge, no skin change and no tenderness. Left breast exhibits no inverted nipple, no mass, no nipple discharge, no skin change and no tenderness. Breasts are symmetrical.       Assessment:     35 year old White female referred to [place] by the [facility] for possible mammogram through one of our programs due to her increased risk of breast cancer due to her strong family history.  Patient's mom was diagnosed at age 16, maternal aunt at age 25 and maternal grandmother at age 33.  She is not aware if anyone had genetic testing.  Based on the timing of diagnosis 30 plus years ago, it is doubtful if testing was available at that time.  Clinical breast exam unremarkable.  Taught self breast awareness.    Plan:     Will get baseline mammogram through our [facility], since patient meets ACR guidelines for mammography based on family history.  Will follow-up through [facility] protocol.

## 2016-09-07 NOTE — Patient Instructions (Signed)
Gave patient hand-out, Women Staying Healthy, Active and Well from [facility], with education on breast health, pap smears, heart and colon health. 

## 2016-09-08 ENCOUNTER — Encounter: Payer: Self-pay | Admitting: *Deleted

## 2016-09-08 ENCOUNTER — Other Ambulatory Visit: Payer: Self-pay | Admitting: *Deleted

## 2016-09-08 DIAGNOSIS — N6489 Other specified disorders of breast: Secondary | ICD-10-CM

## 2016-09-15 ENCOUNTER — Ambulatory Visit
Admission: RE | Admit: 2016-09-15 | Discharge: 2016-09-15 | Disposition: A | Discharge status: Home / Self Care | Payer: Self-pay | Source: Ambulatory Visit | Attending: Oncology | Admitting: Oncology

## 2016-09-15 DIAGNOSIS — N6489 Other specified disorders of breast: Secondary | ICD-10-CM

## 2016-09-24 ENCOUNTER — Encounter: Payer: Self-pay | Admitting: *Deleted

## 2016-09-24 NOTE — Progress Notes (Signed)
Letter mailed from the [facility] to inform patient of her normal mammogram results.  Patient is to follow-up with annual screening in one year.  HSIS to [place]. 

## 2017-09-15 ENCOUNTER — Encounter: Payer: Self-pay | Admitting: *Deleted

## 2017-09-15 ENCOUNTER — Ambulatory Visit: Payer: Self-pay | Attending: Oncology | Admitting: *Deleted

## 2017-09-15 ENCOUNTER — Ambulatory Visit
Admission: RE | Admit: 2017-09-15 | Discharge: 2017-09-15 | Disposition: A | Discharge status: Home / Self Care | Payer: Self-pay | Source: Ambulatory Visit | Attending: Oncology | Admitting: Oncology

## 2017-09-15 VITALS — BP 108/72 | HR 88 | Temp 98.4°F | Ht 63.0 in | Wt 183.0 lb

## 2017-09-15 DIAGNOSIS — Z0000 Encounter for general adult medical examination without abnormal findings: Secondary | ICD-10-CM

## 2017-09-15 NOTE — Patient Instructions (Signed)
Gave patient hand-out, Women Staying Healthy, Active and Well from [facility], with education on breast health, pap smears, heart and colon health. 

## 2017-09-15 NOTE — Progress Notes (Signed)
Subjective:     Patient ID: Heather Harrell, female   DOB: Jan 31, 1981, 36 y.o.   MRN: [id number]  HPI   Review of Systems     Objective:   Physical Exam  Pulmonary/Chest: Right breast exhibits no inverted nipple, no mass, no nipple discharge, no skin change and no tenderness. Left breast exhibits no inverted nipple, no mass, no nipple discharge, no skin change and no tenderness. Breasts are symmetrical.         Assessment:     36 year old White female returns to [facility] for annual screening.  Patient is high risk for breast cancer based on family history as follows:  Mom with breast cancer at age 37, maternal aunt with breast cancer in her 31's, and her maternal grandmother with breast cancer in her 19's.  Discussed genetic testing with patient.  She is to discuss with her mom to see if she would be willing to be tested.  Patient has 5 daughters and discussed.  Encouraged patient to continue annual screening mammograms.  She is agreeable.  Last pap per patient was in 2017.  Patient has been screened for eligibility.  She does not have any insurance, Medicare or Medicaid.  She also meets financial eligibility.  Hand-out given on the Affordable Care Act.    Plan:     Screening mammogram ordered.  Will follow-up per [facility] protocol.

## 2017-09-16 ENCOUNTER — Encounter: Payer: Self-pay | Admitting: *Deleted

## 2017-09-16 NOTE — Progress Notes (Signed)
Letter mailed from the [facility] to inform patient of her normal mammogram results.  Patient is to follow-up with annual screening in one year.  HSIS to [place]. 

## 2019-12-26 NOTE — Progress Notes (Signed)
  Subjective:     Patient ID: Heather Harrell, female   DOB: 1981/09/13, 39 y.o.   MRN: [id number]  HPI   Review of Systems     Objective:   Physical Exam     Assessment:     Due to Covid 19 pandemic a televisit was used to enroll patient into our [facility] program and obtain her health history.  2 identifiers were used to confirm correct patient.  The patient complains of new found right breast mass at 3:00.  States she found an eraser size non-tender mass about 1 week ago.  Patient has a significant family history of breast cancer in her mom at age 62, her maternal aunt in her late 42's to 19, whom is now deceased, and a maternal grandmother with breast cancer, age unknown.  Patients dad had small cell lung cancer and paternal uncle had throat cancer.  Patient is considered high risk for breast cancer based on family history and Tyrer-Cuzick breast cancer risk model of 31% life time risk 20% lifetime risk on the [place].  Patients last pap was completed last year by her primary care provider at the [facility] per patient.  Patient has been screened for eligibility.  She does not have any insurance, Medicare or Medicaid.  She also meets financial eligibility.   Risk Assessment    Risk Scores      12/27/2019   Last edited by: Rico Junker, RN   5-year risk: 1.1 %   Lifetime risk: 20 %            Plan:     Order placed for bilateral diagnostic mammogram and ultrasound.  Referral placed for the high risk breast clinic at the [facility] for possible genetic testing.  Patient is agreeable to the plan.  Will follow up per [facility] protocol.

## 2019-12-27 ENCOUNTER — Ambulatory Visit
Admission: RE | Admit: 2019-12-27 | Discharge: 2019-12-27 | Disposition: A | Discharge status: Home / Self Care | Payer: Medicaid Other | Source: Ambulatory Visit | Attending: Oncology | Admitting: Oncology

## 2019-12-27 ENCOUNTER — Ambulatory Visit: Payer: Self-pay | Attending: Oncology | Admitting: *Deleted

## 2019-12-27 ENCOUNTER — Encounter: Payer: Self-pay | Admitting: *Deleted

## 2019-12-27 DIAGNOSIS — N63 Unspecified lump in unspecified breast: Secondary | ICD-10-CM | POA: Insufficient documentation

## 2019-12-27 DIAGNOSIS — Z9189 Other specified personal risk factors, not elsewhere classified: Secondary | ICD-10-CM

## 2019-12-27 NOTE — Progress Notes (Signed)
Called and reviewed benign mammogram results.  Discussed option to return for a repeat clinical breast exam in about 3 months to ensure stability.  Informed to call before then if the feels the area of concern is changing or getting larger.  She is agreeable.  I have scheduled her to return to [facility] on 03/27/20 at 11:00.  Patient has appointment with Dr. Rogue Bussing on 12/29/19 for high risk for breast cancer.  Copy of Tyrer-Cuzick risk assessment given to Dr. Rogue Bussing.

## 2019-12-28 ENCOUNTER — Other Ambulatory Visit: Payer: Self-pay

## 2019-12-28 NOTE — Progress Notes (Unsigned)
Patient pre screened for office appointment, no questions or concerns today. Patient reminded of upcoming appointment time and date. 

## 2019-12-29 ENCOUNTER — Encounter: Payer: Self-pay | Admitting: Internal Medicine

## 2019-12-29 ENCOUNTER — Other Ambulatory Visit: Payer: Self-pay

## 2019-12-29 ENCOUNTER — Inpatient Hospital Stay: Payer: Self-pay | Attending: Internal Medicine | Admitting: Internal Medicine

## 2019-12-29 DIAGNOSIS — Z9189 Other specified personal risk factors, not elsewhere classified: Secondary | ICD-10-CM

## 2019-12-29 MED ORDER — TAMOXIFEN CITRATE 20 MG PO TABS: 20.0000 mg | ORAL_TABLET | Freq: Every day | ORAL | 6 refills | Status: DC

## 2019-12-29 NOTE — Progress Notes (Unsigned)
[place] NOTE  Patient Care Team: Center, [facility] as PCP - General (General Practice) Rico Junker, RN as Registered Nurse Theodore Demark, RN as Registered Nurse  CHIEF COMPLAINTS/PURPOSE OF CONSULTATION: high risk of breast cancer  #January 2021-breast cancer-high risk [IBIS score of 30% lifetime]; February 2021-tamoxifen.  # LEEP [2006-2007]- [facility]  #  Oncology History   No history exists.     HISTORY OF PRESENTING ILLNESS:  Heather Harrell 39 y.o.  female with no personal history of breast malignancy; prior history of breast biopsy has been referred to [place] for further evaluation recommendations for consideration of chemoprevention given high risk of breast cancer.  Patient had a mass in the right breast which further led to mammogram-that shows bilateral cysts-but no evidence of any solid mass concerning for malignancy. Patient is considered high risk for breast cancer based on family history and Tyrer-Cuzick breast cancer risk model of 31% life time risk 20% lifetime risk on the [place].  She is here to discuss chemoprevention options.  She is quite nervous.   Review of Systems  Constitutional: Negative for chills, diaphoresis, fever, malaise/fatigue and weight loss.  HENT: Negative for nosebleeds and sore throat.   Eyes: Negative for double vision.  Respiratory: Negative for cough, hemoptysis, sputum production, shortness of breath and wheezing.   Cardiovascular: Negative for chest pain, palpitations, orthopnea and leg swelling.  Gastrointestinal: Negative for abdominal pain, blood in stool, constipation, diarrhea, heartburn, melena, nausea and vomiting.  Genitourinary: Negative for dysuria, frequency and urgency.  Musculoskeletal: Negative for back pain and joint pain.  Skin: Negative.  Negative for itching and rash.  Neurological: Negative for dizziness, tingling, focal weakness, weakness and  headaches.  Endo/Heme/Allergies: Does not bruise/bleed easily.  Psychiatric/Behavioral: Negative for depression. The patient is not nervous/anxious and does not have insomnia.      MEDICAL HISTORY:  Past Medical History:  Diagnosis Date  . Acute bronchitis 09/06/2007  . Acute onset aura migraine 09/22/2006  . Biliary calculi 02/26/2016  . Calculus of kidney 02/26/2016  . Chronic infection of sinus 12/07/2008  . Female genital symptoms 11/26/2006   ASSESSMENT: Assume UTI as etiology of Sx. Pain in LLQ is less today. If pain persist or does not improve on antibiotic, will proceed with pelvic US to R/O ovarian cyst. Instructed pt that if pain fot worse or she developed fever to report to ER. Pt verb understanding.     SURGICAL HISTORY: Past Surgical History:  Procedure Laterality Date  . CESAREAN SECTION      SOCIAL HISTORY: Social History   Socioeconomic History  . Marital status: Married    Spouse name: Not on file  . Number of children: Not on file  . Years of education: Not on file  . Highest education level: Not on file  Occupational History  . Not on file  Tobacco Use  . Smoking status: Current Every Day Smoker    Packs/day: 1.00    Types: Cigarettes  . Smokeless tobacco: Never Used  Substance and Sexual Activity  . Alcohol use: No    Alcohol/week: 0.0 standard drinks  . Drug use: No  . Sexual activity: Not on file  Other Topics Concern  . 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 Difficulty of Paying Living  Expenses: Not on file  Food Insecurity:   . Worried About Charity fundraiser in the Last Year: Not on file  . Ran Out of Food in the Last Year: Not on file  Transportation Needs:   . Lack of Transportation (Medical): Not on file  . Lack of Transportation (Non-Medical): Not on file  Physical Activity:   . Days of  Exercise per Week: Not on file  . Minutes of Exercise per Session: Not on file  Stress:   . Feeling of Stress : Not on file  Social Connections:   . Frequency of Communication with Friends and Family: Not on file  . Frequency of Social Gatherings with Friends and Family: Not on file  . Attends Religious Services: Not on file  . Active Member of Clubs or Organizations: Not on file  . Attends Archivist Meetings: Not on file  . Marital Status: Not on file  Intimate Partner Violence:   . Fear of Current or Ex-Partner: Not on file  . Emotionally Abused: Not on file  . Physically Abused: Not on file  . Sexually Abused: Not on file    FAMILY HISTORY: Family History  Problem Relation Age of Onset  . Breast cancer Mother 36       mom- melanoma.   . Breast cancer Maternal Aunt 50  . Breast cancer Maternal Grandmother        80's; melanoma   . Prostate cancer Maternal Uncle     ALLERGIES:  is allergic to ivp dye [iodinated diagnostic agents] and codeine.  MEDICATIONS:  Current Outpatient Medications  Medication Sig Dispense Refill  . albuterol (PROVENTIL HFA;VENTOLIN HFA) 108 (90 BASE) MCG/ACT inhaler Inhale 2 puffs into the lungs every 4 (four) hours as needed for wheezing or shortness of breath. Dispense with aerochamber 1 Inhaler 0  . ibuprofen (ADVIL,MOTRIN) 800 MG tablet Take 1 tablet (800 mg total) by mouth every 6 (six) hours as needed. 30 tablet 0  . omeprazole (PRILOSEC) 10 MG capsule Take 10 mg by mouth daily.    . tamoxifen (NOLVADEX) 20 MG tablet Take 1 tablet (20 mg total) by mouth daily. 30 tablet 6   No current facility-administered medications for this visit.      Marland Kitchen  PHYSICAL EXAMINATION: ECOG PERFORMANCE STATUS: 0 - Asymptomatic  Vitals:   12/29/19 1447  BP: 133/82  Pulse: 90  Temp: (!) 96.4 F (35.8 C)   Filed Weights   12/29/19 1447  Weight: 153 lb 8 oz (69.6 kg)    Physical Exam  Constitutional: She is oriented to person, place, and  time and well-developed, well-nourished, and in no distress.  HENT:  Head: Normocephalic and atraumatic.  Mouth/Throat: Oropharynx is clear and moist. No oropharyngeal exudate.  Eyes: Pupils are equal, round, and reactive to light.  Cardiovascular: Normal rate and regular rhythm.  Pulmonary/Chest: Effort normal and breath sounds normal. No respiratory distress. She has no wheezes.  Abdominal: Soft. Bowel sounds are normal. She exhibits no distension and no mass. There is no abdominal tenderness. There is no rebound and no guarding.  Musculoskeletal:        General: No tenderness or edema. Normal range of motion.     Cervical back: Normal range of motion and neck supple.  Neurological: She is alert and oriented to person, place, and time.  Skin: Skin is warm.  Psychiatric: Affect normal.     LABORATORY DATA:  I have reviewed the data as listed Lab Results  Component  Value Date   WBC 9.5 07/31/2015   HGB 12.7 07/31/2015   HCT 38.1 07/31/2015   MCV 87.2 07/31/2015   PLT 259 07/31/2015   No results for input(s): NA, K, CL, CO2, GLUCOSE, BUN, CREATININE, CALCIUM, GFRNONAA, GFRAA, PROT, ALBUMIN, AST, ALT, ALKPHOS, BILITOT, BILIDIR, IBILI in the last 8760 hours.  RADIOGRAPHIC STUDIES: I have personally reviewed the radiological images as listed and agreed with the findings in the report. US BREAST LTD UNI LEFT INC AXILLA  Result Date: 12/27/2019 CLINICAL DATA:  Patient presents for palpable abnormality within the medial right breast. EXAM: DIGITAL DIAGNOSTIC BILATERAL MAMMOGRAM WITH CAD AND TOMO ULTRASOUND BILATERAL BREAST COMPARISON:  Previous exam(s). ACR Breast Density Category b: There are scattered areas of fibroglandular density. FINDINGS: No suspicious abnormality within the medial right breast underlying the palpable marker. Within the retroareolar right breast there is a small oval circumscribed mass. Within the outer aspect of the right breast posterior depth there are 2 adjacent  oval circumscribed masses. Within the outer aspect of the left breast middle depth there is a low-density lobular mass. No additional findings within the right or left breast. Mammographic images were processed with CAD. On physical exam, no discrete mass is palpated at the site of palpable concern within the medial right breast. Targeted ultrasound is performed, showing a 4 x 8 x 6 mm cluster of cysts retroareolar right breast 12 o'clock position. No suspicious abnormality within the medial right breast at the patient reported site of palpable concern. There is a 5 x 5 x 3 mm complicated cyst right breast 9 o'clock position 4 cm from nipple. There is a 5 x 4 x 2 mm complicated cyst right breast 9 o'clock position 5 cm from nipple. There is a 4 x 2 x 3 mm complicated cyst right breast 9 o'clock position 7 cm from nipple. Within the left breast 2 o'clock position 4 cm from nipple there is a 9 x 6 x 14 mm cluster of cysts. IMPRESSION: No suspicious abnormality at the site of palpable concern medial right breast. Bilateral cysts and mildly complicated cysts. RECOMMENDATION: Screening mammogram in one year.(Code:SM-B-01Y). Continued clinical evaluation for right breast palpable area of concern. I have discussed the findings and recommendations with the patient. If applicable, a reminder letter will be sent to the patient regarding the next appointment. BI-RADS CATEGORY  2: Benign. Electronically Signed   By: Lovey Newcomer M.D.   On: 12/27/2019 11:53   US BREAST LTD UNI RIGHT INC AXILLA  Result Date: 12/27/2019 CLINICAL DATA:  Patient presents for palpable abnormality within the medial right breast. EXAM: DIGITAL DIAGNOSTIC BILATERAL MAMMOGRAM WITH CAD AND TOMO ULTRASOUND BILATERAL BREAST COMPARISON:  Previous exam(s). ACR Breast Density Category b: There are scattered areas of fibroglandular density. FINDINGS: No suspicious abnormality within the medial right breast underlying the palpable marker. Within the  retroareolar right breast there is a small oval circumscribed mass. Within the outer aspect of the right breast posterior depth there are 2 adjacent oval circumscribed masses. Within the outer aspect of the left breast middle depth there is a low-density lobular mass. No additional findings within the right or left breast. Mammographic images were processed with CAD. On physical exam, no discrete mass is palpated at the site of palpable concern within the medial right breast. Targeted ultrasound is performed, showing a 4 x 8 x 6 mm cluster of cysts retroareolar right breast 12 o'clock position. No suspicious abnormality within the medial right breast at the patient reported  site of palpable concern. There is a 5 x 5 x 3 mm complicated cyst right breast 9 o'clock position 4 cm from nipple. There is a 5 x 4 x 2 mm complicated cyst right breast 9 o'clock position 5 cm from nipple. There is a 4 x 2 x 3 mm complicated cyst right breast 9 o'clock position 7 cm from nipple. Within the left breast 2 o'clock position 4 cm from nipple there is a 9 x 6 x 14 mm cluster of cysts. IMPRESSION: No suspicious abnormality at the site of palpable concern medial right breast. Bilateral cysts and mildly complicated cysts. RECOMMENDATION: Screening mammogram in one year.(Code:SM-B-01Y). Continued clinical evaluation for right breast palpable area of concern. I have discussed the findings and recommendations with the patient. If applicable, a reminder letter will be sent to the patient regarding the next appointment. BI-RADS CATEGORY  2: Benign. Electronically Signed   By: Lovey Newcomer M.D.   On: 12/27/2019 11:53   MS DIGITAL DIAG TOMO BILAT  Result Date: 12/27/2019 CLINICAL DATA:  Patient presents for palpable abnormality within the medial right breast. EXAM: DIGITAL DIAGNOSTIC BILATERAL MAMMOGRAM WITH CAD AND TOMO ULTRASOUND BILATERAL BREAST COMPARISON:  Previous exam(s). ACR Breast Density Category b: There are scattered areas of  fibroglandular density. FINDINGS: No suspicious abnormality within the medial right breast underlying the palpable marker. Within the retroareolar right breast there is a small oval circumscribed mass. Within the outer aspect of the right breast posterior depth there are 2 adjacent oval circumscribed masses. Within the outer aspect of the left breast middle depth there is a low-density lobular mass. No additional findings within the right or left breast. Mammographic images were processed with CAD. On physical exam, no discrete mass is palpated at the site of palpable concern within the medial right breast. Targeted ultrasound is performed, showing a 4 x 8 x 6 mm cluster of cysts retroareolar right breast 12 o'clock position. No suspicious abnormality within the medial right breast at the patient reported site of palpable concern. There is a 5 x 5 x 3 mm complicated cyst right breast 9 o'clock position 4 cm from nipple. There is a 5 x 4 x 2 mm complicated cyst right breast 9 o'clock position 5 cm from nipple. There is a 4 x 2 x 3 mm complicated cyst right breast 9 o'clock position 7 cm from nipple. Within the left breast 2 o'clock position 4 cm from nipple there is a 9 x 6 x 14 mm cluster of cysts. IMPRESSION: No suspicious abnormality at the site of palpable concern medial right breast. Bilateral cysts and mildly complicated cysts. RECOMMENDATION: Screening mammogram in one year.(Code:SM-B-01Y). Continued clinical evaluation for right breast palpable area of concern. I have discussed the findings and recommendations with the patient. If applicable, a reminder letter will be sent to the patient regarding the next appointment. BI-RADS CATEGORY  2: Benign. Electronically Signed   By: Lovey Newcomer M.D.   On: 12/27/2019 11:53    ASSESSMENT & PLAN:   At high risk for breast cancer #Patient high risk for breast cancer IBIS-lifetime risk of 31%; average woman [up to age of 85]-is about 10%.   #Discussed the option  of chemoprevention with tamoxifen.  I discussed role of anti-hormonal therapy and mechanism of action.  Discussed that risk of development of cancer would be cut down by about 50%.   Also had long discussion regarding the potential adverse events on tamoxifen including but not limited to hot flashes, mood  swings, thromboembolic events strokes and also small risk of uterine cancers.  Also discussed the drug interaction with older SSRI.   #Genetic counseling-recommended given family history of breast cancer.  Thank you,  for allowing me to participate in the care of your pleasant patient. Please do not hesitate to contact me with questions or concerns in the interim.  DISPOSITION:  # genetic counselling- family hx of breast cancer # follow up- MD-mychart -VIDEO visit in 6-8 weeks- Dr.B  Cc; Sheena/Anne.        All questions were answered. The patient knows to call the clinic with any problems, questions or concerns.       Cammie Sickle, MD 01/02/2020 5:25 PM

## 2019-12-29 NOTE — Assessment & Plan Note (Addendum)
#  Patient high risk for breast cancer IBIS-lifetime risk of 31%; average woman [up to age of 85]-is about 10%.   #Discussed the option of chemoprevention with tamoxifen.  I discussed role of anti-hormonal therapy and mechanism of action.  Discussed that risk of development of cancer would be cut down by about 50%.   Also had long discussion regarding the potential adverse events on tamoxifen including but not limited to hot flashes, mood swings, thromboembolic events strokes and also small risk of uterine cancers.  Also discussed the drug interaction with older SSRI.   #Genetic counseling-recommended given family history of breast cancer.  #Smoking- Discussed with the patient regarding the ill effects of smoking- including but not limited to cardiac lung and vascular diseases and malignancies. Counseled against smoking.   #Also discussed the option of exercise/eating healthy-eating green vegetables/ideal weight-shown to cut on the risk of cancers in general.    Thank you for allowing me to participate in the care of your pleasant patient. Please do not hesitate to contact me with questions or concerns in the interim.  DISPOSITION:  # genetic counselling- family hx of breast cancer # follow up- MD-mychart -VIDEO visit in 6-8 weeks- Dr.B  Cc; Sheena/Anne.

## 2020-02-09 ENCOUNTER — Inpatient Hospital Stay: Payer: Medicaid Other | Admitting: Internal Medicine

## 2020-03-01 ENCOUNTER — Encounter: Payer: Self-pay | Admitting: Licensed Clinical Social Worker

## 2020-03-01 ENCOUNTER — Other Ambulatory Visit: Payer: Self-pay

## 2020-03-01 ENCOUNTER — Inpatient Hospital Stay: Payer: Self-pay | Admitting: Internal Medicine

## 2020-03-01 NOTE — Progress Notes (Signed)
Pt no showed for her appt on April, 2nd, 2021.  GB

## 2020-03-01 NOTE — Assessment & Plan Note (Signed)
#  Patient high risk for breast cancer IBIS-lifetime risk of 31%; average woman [up to age of 85]-is about 10%.   #Discussed the option of chemoprevention with tamoxifen.  I discussed role of anti-hormonal therapy and mechanism of action.  Discussed that risk of development of cancer would be cut down by about 50%.   Also had long discussion regarding the potential adverse events on tamoxifen including but not limited to hot flashes, mood swings, thromboembolic events strokes and also small risk of uterine cancers.  Also discussed the drug interaction with older SSRI.   #Genetic counseling-recommended given family history of breast cancer.  #Smoking- Discussed with the patient regarding the ill effects of smoking- including but not limited to cardiac lung and vascular diseases and malignancies. Counseled against smoking.   #Also discussed the option of exercise/eating healthy-eating green vegetables/ideal weight-shown to cut on the risk of cancers in general.    Thank you for allowing me to participate in the care of your pleasant patient. Please do not hesitate to contact me with questions or concerns in the interim.  DISPOSITION:  # genetic counselling- family hx of breast cancer # follow up- MD-mychart -VIDEO visit in 6-8 weeks- Dr.B  Cc; Sheena/Anne.

## 2020-03-15 ENCOUNTER — Encounter: Payer: Self-pay | Admitting: *Deleted

## 2020-03-15 ENCOUNTER — Inpatient Hospital Stay: Payer: Medicaid Other | Admitting: Internal Medicine

## 2020-03-15 NOTE — Assessment & Plan Note (Signed)
#  Patient high risk for breast cancer IBIS-lifetime risk of 31%; average woman [up to age of 85]-is about 10%.   #Discussed the option of chemoprevention with tamoxifen.  I discussed role of anti-hormonal therapy and mechanism of action.  Discussed that risk of development of cancer would be cut down by about 50%.   Also had long discussion regarding the potential adverse events on tamoxifen including but not limited to hot flashes, mood swings, thromboembolic events strokes and also small risk of uterine cancers.  Also discussed the drug interaction with older SSRI.   #Genetic counseling-recommended given family history of breast cancer.  #Smoking- Discussed with the patient regarding the ill effects of smoking- including but not limited to cardiac lung and vascular diseases and malignancies. Counseled against smoking.   #Also discussed the option of exercise/eating healthy-eating green vegetables/ideal weight-shown to cut on the risk of cancers in general.    DISPOSITION:  # genetic counselling- family hx of breast cancer # follow up- MD-mychart -VIDEO visit in 6-8 weeks- Dr.B  Cc; Sheena/Anne.

## 2020-03-15 NOTE — Progress Notes (Unsigned)
I connected with Heather Harrell on 03/15/20 at  2:30 PM EDT by {Blank single:19197::"video enabled telemedicine visit","telephone visit"} and verified that I am speaking with the correct person using two identifiers.  I discussed the limitations, risks, security and privacy concerns of performing an evaluation and management service by telemedicine and the availability of in-person appointments. I also discussed with the patient that there may be a patient responsible charge related to this service. The patient expressed understanding and agreed to proceed.    Other persons participating in the visit and their role in the encounter: RN/medical reconciliation Patient's location: *** Provider's location: Home  Oncology History   No history exists.     Chief Complaint: ***    History of present illness:Heather Harrell 39 y.o.  female with history of   Observation/objective:  Assessment and plan: No problem-specific Assessment & Plan notes found for this encounter.    Follow-up instructions:  I discussed the assessment and treatment plan with the patient.  The patient was provided an opportunity to ask questions and all were answered.  The patient agreed with the plan and demonstrated understanding of instructions.  The patient was advised to call back or seek an in person evaluation if the symptoms worsen or if the condition fails to improve as anticipated.  I provided *** minutes of {Blank single:19197::"face-to-face video visit time","non face-to-face telephone visit time"} during this encounter, and > 50% was spent counseling as documented under my assessment & plan.   Dr. Charlaine Dalton [facility] at [facility] 03/15/2020 2:28 PM

## 2020-03-27 ENCOUNTER — Ambulatory Visit: Payer: Medicaid Other | Attending: Oncology

## 2020-08-13 LAB — HM PAP SMEAR: HM Pap smear: NEGATIVE

## 2020-12-30 NOTE — Progress Notes (Unsigned)
Patient pre-screened for BCCCP eligibility due to COVID 19 precautions. Two patient identifiers used for verification that I was speaking to correct patient.  Patient reports she is experiencing non-cyclical retro areolar right breast pain x 2 weeks.  States she cannot come to [facility] clinic visit at 8:00, but she can go for her mammogram at 9:30.   Patient to Present directly to [facility] 12/31/20 at 9:30 for BCCCP diagnostic mammogram, and ultrasound.  She has a  History or multiple right breast cysts; Strong family history of breast cancer. Was seen by Dr. Rogue Bussing in high risk breast clinic.  States she will be enrolled in insurance program January 28, 2021.  States she had normal pap at [facility] in September 2021.  Requesting pap results for chart.

## 2020-12-31 ENCOUNTER — Ambulatory Visit: Payer: Self-pay | Attending: Oncology | Admitting: *Deleted

## 2020-12-31 ENCOUNTER — Encounter: Payer: Self-pay | Admitting: Family Medicine

## 2020-12-31 ENCOUNTER — Ambulatory Visit
Admission: RE | Admit: 2020-12-31 | Discharge: 2020-12-31 | Disposition: A | Discharge status: Home / Self Care | Payer: Self-pay | Source: Ambulatory Visit | Attending: Oncology | Admitting: Oncology

## 2020-12-31 ENCOUNTER — Other Ambulatory Visit: Payer: Self-pay

## 2020-12-31 ENCOUNTER — Ambulatory Visit: Payer: Self-pay

## 2020-12-31 DIAGNOSIS — N644 Mastodynia: Secondary | ICD-10-CM

## 2021-01-17 ENCOUNTER — Encounter: Payer: Self-pay | Admitting: *Deleted

## 2021-01-17 ENCOUNTER — Other Ambulatory Visit: Payer: Self-pay | Admitting: *Deleted

## 2021-01-17 DIAGNOSIS — Z9189 Other specified personal risk factors, not elsewhere classified: Secondary | ICD-10-CM

## 2021-01-17 NOTE — Progress Notes (Signed)
Called patient to follow up on her breast pain.  Patient states the pain is better.  We discussed that she is considered at high risk for breast cancer and could benefit for annual MRI 's also.  She states she now has insurance and will be seeing a new PCP in March at [facility].  She is going to discuss with them to order a breast MRI in 6 months.  I also questioned if she ever followed up with genetic testing.  States she did not, but is interested.  Order placed for referral to genetics.

## 2021-01-21 ENCOUNTER — Encounter: Payer: Self-pay | Admitting: *Deleted

## 2021-01-22 ENCOUNTER — Encounter: Payer: Self-pay | Admitting: *Deleted

## 2021-01-22 NOTE — Progress Notes (Signed)
Appointment scheduled to see Dr. Celine Ahr tomorrow at 2:00.  Patient informed.

## 2021-01-23 ENCOUNTER — Other Ambulatory Visit: Payer: Self-pay

## 2021-01-23 ENCOUNTER — Ambulatory Visit (INDEPENDENT_AMBULATORY_CARE_PROVIDER_SITE_OTHER): Payer: Self-pay | Admitting: General Surgery

## 2021-01-23 ENCOUNTER — Encounter: Payer: Self-pay | Admitting: General Surgery

## 2021-01-23 VITALS — BP 128/85 | HR 96 | Temp 98.5°F | Ht 63.0 in | Wt 162.4 lb

## 2021-01-23 DIAGNOSIS — N644 Mastodynia: Secondary | ICD-10-CM

## 2021-01-23 NOTE — Patient Instructions (Addendum)
You can try Evening Primrose oil 1-2 g daily for breast tenderness and you can gradually go up on dosage. If you have any concerns or questions, please feel free to call our office.     Breast Tenderness Breast tenderness is a common problem for women of all ages, but may also occur in men. Breast tenderness may range from mild discomfort to severe pain. In women, the pain usually comes and goes with the menstrual cycle, but it can also be constant. Breast tenderness has many possible causes, including hormone changes, infections, and taking certain medicines. You may have tests, such as a mammogram or an ultrasound, to check for any unusual findings. Having breast tenderness usually does not mean that you have breast cancer. Follow these instructions at home: Managing pain and discomfort  If directed, put ice to the painful area. To do this: ? Put ice in a plastic bag. ? Place a towel between your skin and the bag. ? Leave the ice on for 20 minutes, 2-3 times a day.  Wear a supportive bra, especially during exercise. You may also want to wear a supportive bra while sleeping if your breasts are very tender.   Medicines  Take over-the-counter and prescription medicines only as told by your health care provider. If the cause of your pain is infection, you may be prescribed an antibiotic medicine.  If you were prescribed an antibiotic, take it as told by your health care provider. Do not stop taking the antibiotic even if you start to feel better. Eating and drinking  Your health care provider may recommend that you lessen the amount of fat in your diet. You can do this by: ? Limiting fried foods. ? Cooking foods using methods such as baking, boiling, grilling, and broiling.  Decrease the amount of caffeine in your diet. Instead, drink more water and choose caffeine-free drinks. General instructions  Keep a log of the days and times when your breasts are most tender.  Ask your health  care provider how to do breast exams at home. This will help you notice if you have an unusual growth or lump.  Keep all follow-up visits as told by your health care provider. This is important.   Contact a health care provider if:  Any part of your breast is hard, red, and hot to the touch. This may be a sign of infection.  You are a woman and: ? Not breastfeeding and you have fluid, especially blood or pus, coming out of your nipples. ? Have a new or painful lump in your breast that remains after your menstrual period ends.  You have a fever.  Your pain does not improve or it gets worse.  Your pain is interfering with your daily activities. Summary  Breast tenderness may range from mild discomfort to severe pain.  Breast tenderness has many possible causes, including hormone changes, infections, and taking certain medicines.  It can be treated with ice, wearing a supportive bra, and medicines.  Make changes to your diet if told to by your health care provider. This information is not intended to replace advice given to you by your health care provider. Make sure you discuss any questions you have with your health care provider. Document Revised: 04/10/2019 Document Reviewed: 04/10/2019 Elsevier Patient Education  [place].

## 2021-01-23 NOTE — Progress Notes (Signed)
Patient ID: Heather Harrell, female   DOB: 1981/11/18, 40 y.o.   MRN: [id number]  Chief Complaint  Patient presents with  . New Patient (Initial Visit)    Bilateral breast pain    HPI Heather Harrell is a 40 y.o. female.  She has been referred for evaluation of bilateral breast pain.  She reports that it started roughly around 25 January or thereabouts.  Her menstrual cycle started on January 22, according to the electronic medical record.  She reports that she had bleeding for 19 days.  This is abnormal for her.  She normally bleeds for about 1 week and has a roughly 27-day interval.  She describes the pain as retroareolar and is worse on the left compared to the right.  She has never had a breast biopsy.  She has not taken birth control in over 10 years.  She has a strong family history of breast cancer including her mother, her grandmother, and an aunt.  She is still having regular periods.  She has had 6 pregnancies, the first at the age of 85.  She did not breast-feed her children.  She apparently had a breast cyst in 2021.  She denies any skin changes or nipple discharge.  She does not perform monthly self breast exams.  She says that the pain is alleviated with ibuprofen and very hot towels.  She had a mammogram and breast ultrasound on December 31, 2020.  These were negative for any concerning findings or any potential etiology for her breast discomfort.  She does smoke, approximately 1 pack of cigarettes daily.  She consumes 1 to 2 cups of coffee per day and otherwise drinks water.  She does not know when her mother or other female family members went through menopause.   Past Medical History:  Diagnosis Date  . Acute bronchitis 09/06/2007  . Acute onset aura migraine 09/22/2006  . Biliary calculi 02/26/2016  . Calculus of kidney 02/26/2016  . Chronic infection of sinus 12/07/2008  . Female genital symptoms 11/26/2006   ASSESSMENT: Assume UTI as etiology of Sx. Pain in LLQ is less  today. If pain persist or does not improve on antibiotic, will proceed with pelvic US to R/O ovarian cyst. Instructed pt that if pain fot worse or she developed fever to report to ER. Pt verb understanding.     Past Surgical History:  Procedure Laterality Date  . CESAREAN SECTION    . TUBAL LIGATION      Family History  Problem Relation Age of Onset  . Breast cancer Mother 79       mom- melanoma.   . Breast cancer Maternal Aunt 50  . Breast cancer Maternal Grandmother        80's; melanoma   . Prostate cancer Maternal Uncle     Social History Social History   Tobacco Use  . Smoking status: Current Every Day Smoker    Packs/day: 1.00    Types: Cigarettes  . Smokeless tobacco: Never Used  Substance Use Topics  . Alcohol use: No    Alcohol/week: 0.0 standard drinks  . Drug use: No    Allergies  Allergen Reactions  . Ivp Dye [Iodinated Diagnostic Agents] Itching  . Codeine Itching and Hives    Current Outpatient Medications  Medication Sig Dispense Refill  . ibuprofen (ADVIL,MOTRIN) 800 MG tablet Take 1 tablet (800 mg total) by mouth every 6 (six) hours as needed. 30 tablet 0   No current facility-administered medications for this  visit.    Review of Systems Review of Systems  All other systems reviewed and are negative.   Blood pressure 128/85, pulse 96, temperature 98.5 F (36.9 C), temperature source Oral, height 5\' 3"  (1.6 m), weight 162 lb 6.4 oz (73.7 kg), last menstrual period 12/21/2020, SpO2 96 %. Body mass index is 28.77 kg/m.  Physical Exam Physical Exam Vitals reviewed. Exam conducted with a chaperone present.  Constitutional:      General: She is not in acute distress.    Appearance: Normal appearance.  HENT:     Head: Normocephalic and atraumatic.     Nose:     Comments: Covered with a mask    Mouth/Throat:     Comments: Covered with a mask Eyes:     General: No scleral icterus.       Right eye: No discharge.        Left eye: No  discharge.     Conjunctiva/sclera: Conjunctivae normal.  Neck:     Comments: The trachea is midline.  There is no palpable cervical or supraclavicular lymphadenopathy.  No dominant thyroid masses or thyromegaly appreciated.  The gland moves freely with deglutition. Cardiovascular:     Rate and Rhythm: Normal rate and regular rhythm.     Pulses: Normal pulses.  Pulmonary:     Effort: Pulmonary effort is normal. No respiratory distress.     Breath sounds: Normal breath sounds.  Chest:  Breasts:     Right: Tenderness present. No swelling, bleeding, inverted nipple, mass, nipple discharge, skin change, axillary adenopathy or supraclavicular adenopathy.     Left: Tenderness present. No swelling, bleeding, inverted nipple, mass, nipple discharge, skin change, axillary adenopathy or supraclavicular adenopathy.    Abdominal:     General: Bowel sounds are normal.     Palpations: Abdomen is soft.  Genitourinary:    Comments: Deferred Musculoskeletal:        General: No swelling, tenderness or deformity.  Lymphadenopathy:     Upper Body:     Right upper body: No supraclavicular, axillary or pectoral adenopathy.     Left upper body: No supraclavicular, axillary or pectoral adenopathy.  Skin:    General: Skin is warm and dry.  Neurological:     General: No focal deficit present.     Mental Status: She is alert and oriented to person, place, and time.  Psychiatric:        Mood and Affect: Mood normal.        Behavior: Behavior normal.     Data Reviewed I reviewed her mammogram and ultrasound results.  I concur with the radiology interpretation copied here:  CLINICAL DATA:  40 year old female with focal pain in the RETROAREOLAR RIGHT breast for several weeks and also for annual bilateral mammogram. Patient previously determined to have a high lifetime risk for developing breast cancer (greater than 20%) and very strong family history of breast cancer.  EXAM: DIGITAL DIAGNOSTIC  BILATERAL MAMMOGRAM WITH CAD AND TOMOSYNTHESIS  ULTRASOUND RIGHT BREAST  TECHNIQUE: Bilateral digital diagnostic mammography and breast tomosynthesis was performed. Digital images of the breasts were evaluated with computer-aided detection. Targeted ultrasound examination of the Right breast was performed.  COMPARISON:  Previous exam(s).  ACR Breast Density Category b: There are scattered areas of fibroglandular density.  FINDINGS: 2D/3D full field views of both breasts demonstrate no suspicious mass, distortion or worrisome calcifications.  Targeted ultrasound is performed, showing no sonographic abnormalities in the RETROAREOLAR RIGHT breast, in the area of patient's pain.  IMPRESSION: 1. No mammographic or sonographic abnormality in the RETROAREOLAR RIGHT breast, in the area of patient's pain. 2. No mammographic evidence of malignancy within either breast.  RECOMMENDATION: Bilateral screening mammogram in 1 year.  If the patient has a high lifetime risk of developing breast cancer (greater than 20%), bilateral screening breast MRI annually is recommended.  I have discussed the findings and recommendations with the patient. If applicable, a reminder letter will be sent to the patient regarding the next appointment.  BI-RADS CATEGORY  1: Negative.  Assessment I reassured the patient that there was no surgical cause for her breast pain.  I also discussed the contributing factors of caffeine consumption and smoking, as well as the possibility that this is predominantly hormonal in etiology.  Plan I encouraged her to discuss a hormonal evaluation with her PCP or to find an OB/GYN for further evaluation.  Due to her strong family history and risk of breast cancer, she has been encouraged to seek genetic counseling as well as a breast MRI.  She has appointment scheduled for both of these.  She should continue to use ibuprofen and hot compresses as needed for  symptoms.  We also discussed the possibility that evening primrose oil may be effective, although there is no concrete evidence for its use.  I will see her on an as-needed basis    Fredirick Maudlin 01/23/2021, 2:45 PM

## 2021-02-06 ENCOUNTER — Encounter: Payer: Self-pay | Admitting: Physician Assistant

## 2021-02-06 ENCOUNTER — Other Ambulatory Visit: Payer: Self-pay

## 2021-02-06 ENCOUNTER — Ambulatory Visit (INDEPENDENT_AMBULATORY_CARE_PROVIDER_SITE_OTHER): Payer: 59 | Admitting: Physician Assistant

## 2021-02-06 VITALS — BP 125/93 | HR 81 | Temp 98.6°F | Ht 63.0 in | Wt 159.4 lb

## 2021-02-06 DIAGNOSIS — N926 Irregular menstruation, unspecified: Secondary | ICD-10-CM | POA: Diagnosis not present

## 2021-02-06 DIAGNOSIS — R41840 Attention and concentration deficit: Secondary | ICD-10-CM | POA: Diagnosis not present

## 2021-02-06 DIAGNOSIS — R635 Abnormal weight gain: Secondary | ICD-10-CM

## 2021-02-06 DIAGNOSIS — G43809 Other migraine, not intractable, without status migrainosus: Secondary | ICD-10-CM

## 2021-02-06 MED ORDER — IMITREX 50 MG PO TABS: ORAL_TABLET | ORAL | 1 refills | Status: DC

## 2021-02-06 MED ORDER — ONDANSETRON 8 MG PO TBDP: 8.0000 mg | ORAL_TABLET | Freq: Three times a day (TID) | ORAL | 0 refills | Status: DC | PRN

## 2021-02-06 NOTE — Progress Notes (Signed)
New patient visit   Patient: Heather Harrell   DOB: 02/14/1981   40 y.o. Female  MRN: [id number] Visit Date: 02/06/2021  Today's healthcare provider: Trinna Post, PA-C   Chief Complaint  Patient presents with  . New Patient (Initial Visit)  I,Adriana M Pollak,acting as a scribe for Trinna Post, PA-C.,have documented all relevant documentation on the behalf of Trinna Post, PA-C,as directed by  Trinna Post, PA-C while in the presence of Trinna Post, PA-C.  Subjective    Heather Harrell is a 40 y.o. female who presents today as a new patient to establish care.  HPI  Migraines: patient reports taking imitrex 50 mg. Previously on propranolol which lowered her blood pressure too much. She feels comfortable continuing her medication.   Previously on Paxil but felt this did not work well for her and just sedated her. Previously diagnosed with ADHD when she was 61 at [facility] but is unsure that she can get her records. Thinks her issues may be due to attention deficit that is untreated.   Wt Readings from Last 3 Encounters:  02/06/21 159 lb 6.4 oz (72.3 kg)  01/23/21 162 lb 6.4 oz (73.7 kg)  12/29/19 153 lb 8 oz (69.6 kg)     Past Medical History:  Diagnosis Date  . Acute bronchitis 09/06/2007  . Acute onset aura migraine 09/22/2006  . Anemia   . Biliary calculi 02/26/2016  . Blood transfusion without reported diagnosis    2005  . Calculus of kidney 02/26/2016  . Chronic infection of sinus 12/07/2008  . Female genital symptoms 11/26/2006   ASSESSMENT: Assume UTI as etiology of Sx. Pain in LLQ is less today. If pain persist or does not improve on antibiotic, will proceed with pelvic US to R/O ovarian cyst. Instructed pt that if pain fot worse or she developed fever to report to ER. Pt verb understanding.    Past Surgical History:  Procedure Laterality Date  . CESAREAN SECTION     3  . TUBAL LIGATION  2012   Family Status  Relation  Name Status  . Mother  (Not Specified)  . Mat Aunt  (Not Specified)  . MGM  (Not Specified)  . Mat Uncle  (Not Specified)  . Father  (Not Specified)  . Ethlyn Daniels  (Not Specified)   Family History  Problem Relation Age of Onset  . Breast cancer Mother 70       mom- melanoma.   . Diabetes Mother   . Kidney failure Mother   . Breast cancer Maternal Aunt 88  . Breast cancer Maternal Grandmother        80's; melanoma   . Dementia Maternal Grandmother   . Prostate cancer Maternal Uncle   . Cancer Father   . Dementia Paternal Aunt    Social History   Socioeconomic History  . Marital status: Married    Spouse name: Not on file  . Number of children: Not on file  . Years of education: Not on file  . Highest education level: Not on file  Occupational History  . Not on file  Tobacco Use  . Smoking status: Current Every Day Smoker    Packs/day: 1.00    Types: Cigarettes  . Smokeless tobacco: Never Used  Substance and Sexual Activity  . Alcohol use: No    Alcohol/week: 0.0 standard drinks  . Drug use: No  . Sexual activity: Not on file  Other Topics Concern  .  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 ibuprofen (ADVIL,MOTRIN) 800 MG tablet Take 1 tablet (800 mg total) by mouth every 6 (six) hours as needed.  . [DISCONTINUED] IMITREX 50 MG tablet Take 1 tablet by mouth  as needed for migraine, may repeat in 2 hours, max 200mg  per day  . [DISCONTINUED] ondansetron (ZOFRAN-ODT) 8 MG disintegrating tablet Take 8 mg by mouth every 8 (eight) hours as needed.   No facility-administered medications prior to visit.   Allergies   Allergen Reactions  . Ivp Dye [Iodinated Diagnostic Agents] Itching  . Codeine Itching and Hives     There is no immunization history on file for this patient.  Health Maintenance  Topic Date Due  . Hepatitis C Screening  Never done  . COVID-19 Vaccine (1) Never done  . HIV Screening  Never done  . TETANUS/TDAP  Never done  . PAP SMEAR-Modifier  Never done  . INFLUENZA VACCINE  02/27/2021 (Originally 06/30/2020)  . HPV VACCINES  Aged Out    Patient Care Team: Paulene Floor as PCP - General (Physician Assistant) Rico Junker, RN as Registered Nurse Theodore Demark, RN as Registered Nurse  Review of Systems  Constitutional: Negative.   HENT: Negative.   Eyes: Negative.   Respiratory: Negative.   Cardiovascular: Negative.   Gastrointestinal: Negative.   Endocrine: Negative.   Genitourinary: Negative.   Musculoskeletal: Negative.   Skin: Negative.   Allergic/Immunologic: Negative.   Neurological: Negative.   Hematological: Negative.   Psychiatric/Behavioral: Negative.       Objective    BP (!) 125/93 (BP Location: Left Arm, Patient Position: Sitting, Cuff Size: Normal)   Pulse 81   Temp 98.6 F (37 C) (Oral)   Ht 5\' 3"  (1.6 m)   Wt 159 lb 6.4 oz (72.3 kg)   LMP 12/21/2020 (Exact Date)   SpO2 100%   BMI 28.24 kg/m  Physical Exam Constitutional:      Appearance: Normal appearance.  HENT:     Right Ear: Tympanic membrane, ear canal and external ear normal.     Left Ear: Tympanic membrane, ear canal and external ear normal.  Cardiovascular:     Rate and Rhythm: Normal rate and regular rhythm.     Pulses: Normal pulses.     Heart sounds: Normal heart sounds.  Pulmonary:     Effort: Pulmonary effort is normal.     Breath sounds: Normal breath sounds.  Abdominal:     General: Abdomen is flat. Bowel sounds are normal.     Palpations: Abdomen is soft.  Skin:    General: Skin is warm and dry.  Neurological:     General: No focal deficit  present.     Mental Status: She is alert and oriented to person, place, and time.  Psychiatric:        Mood and Affect: Mood normal.        Behavior: Behavior normal.      Depression Screen PHQ 2/9 Scores 02/06/2021  PHQ - 2 Score 0  PHQ- 9 Score 14   No  results found for any visits on 02/06/21.  Assessment & Plan     1. Other migraine without status migrainosus, not intractable  Continue imitrex.   - IMITREX 50 MG tablet; Take 1 tablet by mouth  as needed for migraine, may repeat in 2 hours, max 200mg  per day  Dispense: 10 tablet; Refill: 1 - ondansetron (ZOFRAN-ODT) 8 MG disintegrating tablet; Take 1 tablet (8 mg total) by mouth every 8 (eight) hours as needed.  Dispense: 20 tablet; Refill: 0  2. Irregular periods  Followed by OBGYN.   3. Attention deficit  Referral for formal evaluation.   - Ambulatory referral to Psychology  4. Weight gain  Discussed importance of healthy weight management Discussed diet and exercise    Return if symptoms worsen or fail to improve.     ITrinna Post, PA-C, have reviewed all documentation for this visit. The documentation on 02/11/21 for the exam, diagnosis, procedures, and orders are all accurate and complete.  The entirety of the information documented in the History of Present Illness, Review of Systems and Physical Exam were personally obtained by me. Portions of this information were initially documented by [facility] and reviewed by me for thoroughness and accuracy.     Paulene Floor  [facility] [phone] (phone) [phone] (fax)  [place]

## 2021-02-12 ENCOUNTER — Encounter: Payer: Self-pay | Admitting: Licensed Clinical Social Worker

## 2021-02-12 ENCOUNTER — Inpatient Hospital Stay: Payer: Medicaid Other

## 2021-02-12 ENCOUNTER — Ambulatory Visit: Payer: Self-pay | Admitting: *Deleted

## 2021-02-12 ENCOUNTER — Inpatient Hospital Stay: Payer: Medicaid Other | Attending: Oncology | Admitting: Licensed Clinical Social Worker

## 2021-02-12 ENCOUNTER — Other Ambulatory Visit: Payer: Self-pay

## 2021-02-12 DIAGNOSIS — Z1501 Genetic susceptibility to malignant neoplasm of breast: Secondary | ICD-10-CM

## 2021-02-12 DIAGNOSIS — Z803 Family history of malignant neoplasm of breast: Secondary | ICD-10-CM

## 2021-02-12 DIAGNOSIS — Z801 Family history of malignant neoplasm of trachea, bronchus and lung: Secondary | ICD-10-CM

## 2021-02-12 DIAGNOSIS — Z1509 Genetic susceptibility to other malignant neoplasm: Secondary | ICD-10-CM

## 2021-02-12 NOTE — Telephone Encounter (Signed)
[facility] pharmacist calling to confirm if imitrex 50 mg brand name is correct due to price $629.79 and / or if brand name can be written due to it  is free. Called clinic and pharmacist to review with PCP.   Reason for Disposition . [1] Pharmacy calling with prescription questions AND [2] triager unable to answer question  Answer Assessment - Initial Assessment Questions 1. DRUG NAME: "What medicine do you need to have refilled?"     imitrex 50 mg  2. REFILLS REMAINING: "How many refills are remaining?" (Note: The label on the medicine or pill bottle will show how many refills are remaining. If there are no refills remaining, then a renewal may be needed.)     na 3. EXPIRATION DATE: "What is the expiration date?" (Note: The label states when the prescription will expire, and thus can no longer be refilled.)     na 4. PRESCRIBING HCP: "Who prescribed it?" Reason: If prescribed by specialist, call should be referred to that group.     pcp 5. SYMPTOMS: "Do you have any symptoms?"     na 6. PREGNANCY: "Is there any chance that you are pregnant?" "When was your last menstrual period?"     na  Protocols used: MEDICATION REFILL AND RENEWAL CALL-A-AH

## 2021-02-12 NOTE — Progress Notes (Signed)
REFERRING PROVIDER: Lloyd Huger, MD [place],  [place] [address]  PRIMARY PROVIDER:  Trinna Post, PA-C  PRIMARY REASON FOR VISIT:  1. Family history of breast cancer   2. Family history of lung cancer      HISTORY OF PRESENT ILLNESS:   [name], a 40 y.o. female, was seen for a  cancer genetics consultation at the request of Dr. Grayland Ormond due to a family history of cancer.  [name] presents to clinic today to discuss the possibility of a hereditary predisposition to cancer, genetic testing, and to further clarify her future cancer risks, as well as potential cancer risks for family members.   [name] is a 40 y.o. female with no personal history of cancer.  She was seen by Dr. Grayland Ormond for being high risk for breast cancer, her Harriett Rush was calculated to be 31%.   CANCER HISTORY:  Oncology History   No history exists.     RISK FACTORS:  Menarche was at age 68.  First live birth at age 65.  OCP use for approximately 0 years.  Ovaries intact: yes.  Hysterectomy: no.  Menopausal status: perimenopausal.  HRT use: 0 years. Colonoscopy: no; not examined. Mammogram within the last year: yes. Number of breast biopsies: 0. Up to date with pelvic exams: yes. Any excessive radiation exposure in the past: no  Past Medical History:  Diagnosis Date  . Acute bronchitis 09/06/2007  . Acute onset aura migraine 09/22/2006  . Anemia   . Biliary calculi 02/26/2016  . Blood transfusion without reported diagnosis    2005  . Calculus of kidney 02/26/2016  . Chronic infection of sinus 12/07/2008  . Family history of breast cancer   . Family history of lung cancer   . Female genital symptoms 11/26/2006   ASSESSMENT: Assume UTI as etiology of Sx. Pain in LLQ is less today. If pain persist or does not improve on antibiotic, will proceed with pelvic US to R/O ovarian cyst. Instructed pt that if pain fot worse or she developed fever to report to ER.  Pt verb understanding.     Past Surgical History:  Procedure Laterality Date  . CESAREAN SECTION     3  . TUBAL LIGATION  2012    Social History   Socioeconomic History  . Marital status: Married    Spouse name: Not on file  . Number of children: Not on file  . Years of education: Not on file  . Highest education level: Not on file  Occupational History  . Not on file  Tobacco Use  . Smoking status: Current Every Day Smoker    Packs/day: 1.00    Types: Cigarettes  . Smokeless tobacco: Never Used  Substance and Sexual Activity  . Alcohol use: No    Alcohol/week: 0.0 standard drinks  . Drug use: No  . Sexual activity: Not on file  Other Topics Concern  . 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  Significant diagnoses are listed below: Family History  Problem Relation Age of Onset  .  Breast cancer Mother 105  . Diabetes Mother   . Kidney failure Mother   . Breast cancer Maternal Aunt 64  . Breast cancer Maternal Grandmother        dx 85, d. 28s  . Dementia Maternal Grandmother   . Lung cancer Father 34  . Dementia Paternal Aunt   . Breast cancer Paternal Aunt        d. 39s   [name] has 4 daughters. She has 4 paternal half-brothers, no known cancers.   [name] father had lung cancer and history of smoking, he passed at 56. Patient had 13 paternal aunts and uncles. A paternal uncle died from leukemia. Paternal aunt had breast cancer in her 63s and died in her 66s. No known cancers in cousins, however she has limited information about them. Paternal grandfather died in his 44s,  grandmother died in her 59s.  [name] mother had breast cancer at 15 and is living at 23. Patient has 1 maternal uncle, 1 aunt. Her aunt had breast cancer at 30 and died at 11. No cancers in cousins. Maternal grandmother had breast cancer at 64 and died in her 10s of dementia. Grandfather died in his 9s.   [name] is unaware of previous family history of genetic testing for hereditary cancer risks. Patient's maternal ancestors are of unknown descent, and paternal ancestors are of unknown descent. There is no reported Ashkenazi Jewish ancestry. There is no known consanguinity.   GENETIC COUNSELING ASSESSMENT: [name] is a 40 y.o. female with a family history of breast cancer which is somewhat suggestive of a hereditary cancer syndrome and predisposition to cancer. We, therefore, discussed and recommended the following at today's visit.   DISCUSSION: We discussed that approximately 5-10% of breast cancer is hereditary.  Most cases of hereditary breast cancer are associated with BRCA1/BRCA2 genes, although there are other genes associated with hereditary breast cancer as well including CHEK2, ATM, PALB2 .  We discussed that testing is beneficial for several reasons including  knowing about cancer risks, identifying potential screening and risk-reduction options that may be appropriate, and to understand if other family members could be at risk for cancer and allow them to undergo genetic testing.   We reviewed the characteristics, features and inheritance patterns of hereditary cancer syndromes. We also discussed genetic testing, including the appropriate family members to test, the process of testing, insurance coverage and turn-around-time for results. We discussed the implications of a negative, positive and/or variant of uncertain significant result. We recommended [name] pursue genetic testing for the [facility] CancerNext-Expanded+RNA gene panel.   The CancerNext-Expanded + RNAinsight gene  panel offered by [employer] and includes sequencing and rearrangement analysis for the following 77 genes: IP, ALK, APC*, ATM*, AXIN2, BAP1, BARD1, BLM, BMPR1A, BRCA1*, BRCA2*, BRIP1*, CDC73, CDH1*,CDK4, CDKN1B, CDKN2A, CHEK2*, CTNNA1, DICER1, FANCC, FH, FLCN, GALNT12, KIF1B, LZTR1, MAX, MEN1, MET, MLH1*, MSH2*, MSH3, MSH6*, MUTYH*, NBN, NF1*, NF2, NTHL1, PALB2*, PHOX2B, PMS2*, POT1, PRKAR1A, PTCH1, PTEN*, RAD51C*, RAD51D*,RB1, RECQL, RET, SDHA, SDHAF2, SDHB, SDHC, SDHD, SMAD4, SMARCA4, SMARCB1, SMARCE1, STK11, SUFU, TMEM127, TP53*,TSC1, TSC2, VHL and XRCC2 (sequencing and deletion/duplication); EGFR, EGLN1, HOXB13, KIT, MITF, PDGFRA, POLD1 and POLE (sequencing only); EPCAM and GREM1 (deletion/duplication only).  Based on Ms. Morgan's family history of cancer, she meets medical criteria for genetic testing. Despite that she meets criteria, she may still have an out of pocket cost. We discussed that if her out of pocket cost for testing is over $100, the laboratory will call  and confirm whether she wants to proceed with testing.  If the out of pocket cost of testing is less than $100 she will be billed by the genetic testing laboratory.   PLAN: After considering the risks, benefits, and limitations, [name] provided informed consent to pursue genetic testing and the blood sample was sent to [facility] for analysis of the CancerNext-Expanded+RNA Panel. Results should be available within approximately 2-3 weeks' time, at which point they will be disclosed by telephone to [name], as will any additional recommendations warranted by these results. [name] will receive a summary of her genetic counseling visit and a copy of her results once available. This information will also be available in Epic.   Lastly, we encouraged [name] to remain in contact with cancer genetics annually so that we can continuously update the family history and inform her of any changes in cancer genetics and  testing that may be of benefit for this family.   [name] questions were answered to her satisfaction today. Our contact information was provided should additional questions or concerns arise. Thank you for the referral and allowing [place] to share in the care of your patient.   Faith Rogue, MS, [facility] Genetic Counselor [email or web address] Phone: [phone]  The patient was seen for a total of 30 minutes in face-to-face genetic counseling. [facility] intern Olivia Mackie was also present and assisted with family history. Dr. Grayland Ormond was available for discussion regarding this case.   _______________________________________________________________________ For Office Staff:  Number of people involved in session: 2 Was an Intern/ student involved with case: yes

## 2021-02-13 NOTE — Telephone Encounter (Signed)
She can have brand or generic, whichever her insurance pays for.

## 2021-02-13 NOTE — Telephone Encounter (Signed)
Called patient and she states that a nurse (maybe the [facility]) had went ahead and approved the generic name for Imitrex so that her insurance would cover the medication. Just a [place]

## 2021-02-24 ENCOUNTER — Ambulatory Visit: Payer: Self-pay

## 2021-02-24 NOTE — Telephone Encounter (Signed)
H/A off and on 2 weeks.  BP: 150/92 150/90 Pt stated BP was elevated in GYN office as well. Last Thursday pt reports a headache, blurred vision. No symptoms since then. Denies any numbness, tingling or weakness of face, arms or legs, denies chest pain or SOB. Care advice given and pt verbalized understanding. Appt made for tomorrow am with Dr Caryn Section.  Reason for Disposition . [3] Systolic BP  >= 009 OR Diastolic >= 80 AND [7] not taking BP medications  Answer Assessment - Initial Assessment Questions 1. BLOOD PRESSURE: "What is the blood pressure?" "Did you take at least two measurements 5 minutes apart?"     150/92 2. ONSET: "When did you take your blood pressure?"     Last night 3. HOW: "How did you obtain the blood pressure?" (e.g., visiting nurse, automatic home BP monitor)    manual 4. HISTORY: "Do you have a history of high blood pressure?"    no 5. MEDICATIONS: "Are you taking any medications for blood pressure?" "Have you missed any doses recently?"     no 6. OTHER SYMPTOMS: "Do you have any symptoms?" (e.g., headache, chest pain, blurred vision, difficulty breathing, weakness)     No sx -last Thursday was HTN and HR 120 H/A, Migraine, and nausea blurred vision 7. PREGNANCY: "Is there any chance you are pregnant?" "When was your last menstrual period?"     No/ 1/22  Protocols used: BLOOD PRESSURE - HIGH-A-AH

## 2021-02-25 ENCOUNTER — Ambulatory Visit (INDEPENDENT_AMBULATORY_CARE_PROVIDER_SITE_OTHER): Payer: 59 | Admitting: Psychology

## 2021-02-25 ENCOUNTER — Ambulatory Visit: Payer: Self-pay | Admitting: Family Medicine

## 2021-02-25 DIAGNOSIS — F89 Unspecified disorder of psychological development: Secondary | ICD-10-CM

## 2021-02-27 ENCOUNTER — Ambulatory Visit: Payer: Self-pay | Admitting: Licensed Clinical Social Worker

## 2021-02-27 ENCOUNTER — Telehealth: Payer: Self-pay | Admitting: Licensed Clinical Social Worker

## 2021-02-27 ENCOUNTER — Encounter: Payer: Self-pay | Admitting: Licensed Clinical Social Worker

## 2021-02-27 DIAGNOSIS — Z1379 Encounter for other screening for genetic and chromosomal anomalies: Secondary | ICD-10-CM

## 2021-02-27 DIAGNOSIS — Z803 Family history of malignant neoplasm of breast: Secondary | ICD-10-CM

## 2021-02-27 DIAGNOSIS — Z801 Family history of malignant neoplasm of trachea, bronchus and lung: Secondary | ICD-10-CM

## 2021-02-27 NOTE — Telephone Encounter (Signed)
Revealed negative genetic testing.  This normal result is reassuring.  It is unlikely that there is an increased risk of cancer due to a mutation in one of these genes.  However, genetic testing is not perfect, and cannot definitively rule out a hereditary cause.  It will be important for her to keep in contact with genetics to learn if any additional testing may be needed in the future.      

## 2021-02-27 NOTE — Progress Notes (Signed)
HPI:  [name] was previously seen in the [facility] due to a family history of breast cancer and concerns regarding a hereditary predisposition to cancer. Please refer to our prior cancer genetics clinic note for more information regarding our discussion, assessment and recommendations, at the time. [name] recent genetic test results were disclosed to her, as were recommendations warranted by these results. These results and recommendations are discussed in more detail below.  CANCER HISTORY:  Oncology History   No history exists.    FAMILY HISTORY:  We obtained a detailed, 4-generation family history.  Significant diagnoses are listed below: Family History  Problem Relation Age of Onset  . Breast cancer Mother 74  . Diabetes Mother   . Kidney failure Mother   . Breast cancer Maternal Aunt 16  . Breast cancer Maternal Grandmother        dx 75, d. 49s  . Dementia Maternal Grandmother   . Lung cancer Father 58  . Dementia Paternal Aunt   . Breast cancer Paternal Aunt        d. 10s    [name] has 4 daughters. She has 4 paternal half-brothers, no known cancers.   [name] father had lung cancer and history of smoking, he passed at 68. Patient had 13 paternal aunts and uncles. A paternal uncle died from leukemia. Paternal aunt had breast cancer in her 81s and died in her 17s. No known cancers in cousins, however she has limited information about them. Paternal grandfather died in his 37s, grandmother died in her 21s.  [name] mother had breast cancer at 47 and is living at 81. Patient has 1 maternal uncle, 1 aunt. Her aunt had breast cancer at 73 and died at 48. No cancers in cousins. Maternal grandmother had breast cancer at 71 and died in her 6s of dementia. Grandfather died in his 48s.   [name] is unaware of previous family history of genetic testing for hereditary cancer risks. Patient's maternal ancestors are of unknown descent, and  paternal ancestors are of unknown descent. There is no reported Ashkenazi Jewish ancestry. There is no known consanguinity.    GENETIC TEST RESULTS: Genetic testing reported out on 02/27/2021 through the [facility] CancerNext-Expanded+RNA cancer panel found no pathogenic mutations.   The CancerNext-Expanded + RNAinsight gene panel offered by [employer] and includes sequencing and rearrangement analysis for the following 77 genes: IP, ALK, APC*, ATM*, AXIN2, BAP1, BARD1, BLM, BMPR1A, BRCA1*, BRCA2*, BRIP1*, CDC73, CDH1*,CDK4, CDKN1B, CDKN2A, CHEK2*, CTNNA1, DICER1, FANCC, FH, FLCN, GALNT12, KIF1B, LZTR1, MAX, MEN1, MET, MLH1*, MSH2*, MSH3, MSH6*, MUTYH*, NBN, NF1*, NF2, NTHL1, PALB2*, PHOX2B, PMS2*, POT1, PRKAR1A, PTCH1, PTEN*, RAD51C*, RAD51D*,RB1, RECQL, RET, SDHA, SDHAF2, SDHB, SDHC, SDHD, SMAD4, SMARCA4, SMARCB1, SMARCE1, STK11, SUFU, TMEM127, TP53*,TSC1, TSC2, VHL and XRCC2 (sequencing and deletion/duplication); EGFR, EGLN1, HOXB13, KIT, MITF, PDGFRA, POLD1 and POLE (sequencing only); EPCAM and GREM1 (deletion/duplication only).   The test report has been scanned into EPIC and is located under the Molecular Pathology section of the Results Review tab.  A portion of the result report is included below for reference.     We discussed with [name] that because current genetic testing is not perfect, it is possible there may be a gene mutation in one of these genes that current testing cannot detect, but that chance is small.  We also discussed, that there could be another gene that has not yet been discovered, or that we have not yet tested, that is  responsible for the cancer diagnoses in the family. It is also possible there is a hereditary cause for the cancer in the family that [name] did not inherit and therefore was not identified in her testing.  Therefore, it is important to remain in touch with cancer genetics in the future so that we can continue to offer [name] the most up to date  genetic testing.   ADDITIONAL GENETIC TESTING: We discussed with [name] that her genetic testing was fairly extensive.  If there are genes identified to increase cancer risk that can be analyzed in the future, we would be happy to discuss and coordinate this testing at that time.    CANCER SCREENING RECOMMENDATIONS: [name] test result is considered negative (normal).  This means that we have not identified a hereditary cause for her  family history of cancer at this time.   While reassuring, this does not definitively rule out a hereditary predisposition to cancer. It is still possible that there could be genetic mutations that are undetectable by current technology. There could be genetic mutations in genes that have not been tested or identified to increase cancer risk.  Therefore, it is recommended she continue to follow the cancer management and screening guidelines provided by her primary healthcare provider.   An individual's cancer risk and medical management are not determined by genetic test results alone. Overall cancer risk assessment incorporates additional factors, including personal medical history, family history, and any available genetic information that may result in a personalized plan for cancer prevention and surveillance.  Based on Ms. Morgan's personal and family history of cancer as well as her genetic test results, risk model Harriett Rush was used to estimate her risk of developing breast cancer. This estimates her lifetime risk of developing breast cancer to be approximately 21%.  The patient's lifetime breast cancer risk is a preliminary estimate based on available information using one of several models endorsed by the [place] (ACS). The ACS recommends consideration of breast MRI screening as an adjunct to mammography for patients at high risk (defined as 20% or greater lifetime risk).  She will continue to be followed by the high risk breast  clinic.   RECOMMENDATIONS FOR FAMILY MEMBERS:  Relatives in this family might be at some increased risk of developing cancer, over the general population risk, simply due to the family history of cancer.  We recommended female relatives in this family have a yearly mammogram beginning at age 20, or 34 years younger than the earliest onset of cancer, an annual clinical breast exam, and perform monthly breast self-exams. Female relatives in this family should also have a gynecological exam as recommended by their primary provider.  All family members should be referred for colonoscopy starting at age 23.    It is also possible there is a hereditary cause for the cancer in Ms. Morgan's family that she did not inherit and therefore was not identified in her.  Based on Ms. Morgan's family history, we recommended relatives on both sides have genetic counseling and testing. [name] will let us know if we can be of any assistance in coordinating genetic counseling and/or testing for these family members.  FOLLOW-UP: Lastly, we discussed with [name] that cancer genetics is a rapidly advancing field and it is possible that new genetic tests will be appropriate for her and/or her family members in the future. We encouraged her to remain in contact with cancer genetics on an annual basis  so we can update her personal and family histories and let her know of advances in cancer genetics that may benefit this family.   Our contact number was provided. [name] questions were answered to her satisfaction, and she knows she is welcome to call us at anytime with additional questions or concerns.   Faith Rogue, MS, [facility] Genetic Counselor [email or web address] Phone: [phone]

## 2021-03-04 ENCOUNTER — Telehealth: Payer: Self-pay

## 2021-03-04 ENCOUNTER — Ambulatory Visit: Payer: Self-pay | Admitting: Adult Health

## 2021-03-04 ENCOUNTER — Ambulatory Visit (INDEPENDENT_AMBULATORY_CARE_PROVIDER_SITE_OTHER): Payer: 59 | Admitting: Family Medicine

## 2021-03-04 ENCOUNTER — Encounter: Payer: Self-pay | Admitting: Family Medicine

## 2021-03-04 ENCOUNTER — Other Ambulatory Visit: Payer: Self-pay

## 2021-03-04 VITALS — BP 144/93 | HR 93 | Temp 98.7°F | Wt 158.1 lb

## 2021-03-04 DIAGNOSIS — G43109 Migraine with aura, not intractable, without status migrainosus: Secondary | ICD-10-CM

## 2021-03-04 DIAGNOSIS — Z720 Tobacco use: Secondary | ICD-10-CM | POA: Diagnosis not present

## 2021-03-04 DIAGNOSIS — E663 Overweight: Secondary | ICD-10-CM | POA: Diagnosis not present

## 2021-03-04 DIAGNOSIS — I10 Essential (primary) hypertension: Secondary | ICD-10-CM

## 2021-03-04 MED ORDER — VERAPAMIL HCL ER 120 MG PO TBCR: 120.0000 mg | EXTENDED_RELEASE_TABLET | Freq: Every day | ORAL | 3 refills | Status: DC

## 2021-03-04 NOTE — Assessment & Plan Note (Signed)
New diagnosis Consistently elevated home and office BPs Likely contributing to headaches as above For blood pressure control and migraine prophylaxis, will start verapamil 120 mg daily Reviewed last metabolic panel Discussed goal blood pressure of less than 140/90 Discussed monitoring home blood pressures Discussed DASH diet and regular exercise

## 2021-03-04 NOTE — Assessment & Plan Note (Signed)
Discussed importance of cessation precontemplative

## 2021-03-04 NOTE — Patient Instructions (Signed)
DASH Eating Plan DASH stands for Dietary Approaches to Stop Hypertension. The DASH eating plan is a healthy eating plan that has been shown to:  Reduce high blood pressure (hypertension).  Reduce your risk for type 2 diabetes, heart disease, and stroke.  Help with weight loss. What are tips for following this plan? Reading food labels  Check food labels for the amount of salt (sodium) per serving. Choose foods with less than 5 percent of the Daily Value of sodium. Generally, foods with less than 300 milligrams (mg) of sodium per serving fit into this eating plan.  To find whole grains, look for the word "whole" as the first word in the ingredient list. Shopping  Buy products labeled as "low-sodium" or "no salt added."  Buy fresh foods. Avoid canned foods and pre-made or frozen meals. Cooking  Avoid adding salt when cooking. Use salt-free seasonings or herbs instead of table salt or sea salt. Check with your health care provider or pharmacist before using salt substitutes.  Do not fry foods. Cook foods using healthy methods such as baking, boiling, grilling, roasting, and broiling instead.  Cook with heart-healthy oils, such as olive, canola, avocado, soybean, or sunflower oil. Meal planning  Eat a balanced diet that includes: ? 4 or more servings of fruits and 4 or more servings of vegetables each day. Try to fill one-half of your plate with fruits and vegetables. ? 6-8 servings of whole grains each day. ? Less than 6 oz (170 g) of lean meat, poultry, or fish each day. A 3-oz (85-g) serving of meat is about the same size as a deck of cards. One egg equals 1 oz (28 g). ? 2-3 servings of low-fat dairy each day. One serving is 1 cup (237 mL). ? 1 serving of nuts, seeds, or beans 5 times each week. ? 2-3 servings of heart-healthy fats. Healthy fats called omega-3 fatty acids are found in foods such as walnuts, flaxseeds, fortified milks, and eggs. These fats are also found in  cold-water fish, such as sardines, salmon, and mackerel.  Limit how much you eat of: ? Canned or prepackaged foods. ? Food that is high in trans fat, such as some fried foods. ? Food that is high in saturated fat, such as fatty meat. ? Desserts and other sweets, sugary drinks, and other foods with added sugar. ? Full-fat dairy products.  Do not salt foods before eating.  Do not eat more than 4 egg yolks a week.  Try to eat at least 2 vegetarian meals a week.  Eat more home-cooked food and less restaurant, buffet, and fast food.   Lifestyle  When eating at a restaurant, ask that your food be prepared with less salt or no salt, if possible.  If you drink alcohol: ? Limit how much you use to:  0-1 drink a day for women who are not pregnant.  0-2 drinks a day for men. ? Be aware of how much alcohol is in your drink. In the [place] one drink equals one 12 oz bottle of beer (355 mL), one 5 oz glass of wine (148 mL), or one 1 oz glass of hard liquor (44 mL). General information  Avoid eating more than 2,300 mg of salt a day. If you have hypertension, you may need to reduce your sodium intake to 1,500 mg a day.  Work with your health care provider to maintain a healthy body weight or to lose weight. Ask what an ideal weight is for you.    Get at least 30 minutes of exercise that causes your heart to beat faster (aerobic exercise) most days of the week. Activities may include walking, swimming, or biking.  Work with your health care provider or dietitian to adjust your eating plan to your individual calorie needs. What foods should I eat? Fruits All fresh, dried, or frozen fruit. Canned fruit in natural juice (without added sugar). Vegetables Fresh or frozen vegetables (raw, steamed, roasted, or grilled). Low-sodium or reduced-sodium tomato and vegetable juice. Low-sodium or reduced-sodium tomato sauce and tomato paste. Low-sodium or reduced-sodium canned vegetables. Grains Whole-grain  or whole-wheat bread. Whole-grain or whole-wheat pasta. Brown rice. Oatmeal. Quinoa. Bulgur. Whole-grain and low-sodium cereals. Pita bread. Low-fat, low-sodium crackers. Whole-wheat flour tortillas. Meats and other proteins Skinless chicken or turkey. Ground chicken or turkey. Pork with fat trimmed off. Fish and seafood. Egg whites. Dried beans, peas, or lentils. Unsalted nuts, nut butters, and seeds. Unsalted canned beans. Lean cuts of beef with fat trimmed off. Low-sodium, lean precooked or cured meat, such as sausages or meat loaves. Dairy Low-fat (1%) or fat-free (skim) milk. Reduced-fat, low-fat, or fat-free cheeses. Nonfat, low-sodium ricotta or cottage cheese. Low-fat or nonfat yogurt. Low-fat, low-sodium cheese. Fats and oils Soft margarine without trans fats. Vegetable oil. Reduced-fat, low-fat, or light mayonnaise and salad dressings (reduced-sodium). Canola, safflower, olive, avocado, soybean, and sunflower oils. Avocado. Seasonings and condiments Herbs. Spices. Seasoning mixes without salt. Other foods Unsalted popcorn and pretzels. Fat-free sweets. The items listed above may not be a complete list of foods and beverages you can eat. Contact a dietitian for more information. What foods should I avoid? Fruits Canned fruit in a light or heavy syrup. Fried fruit. Fruit in cream or butter sauce. Vegetables Creamed or fried vegetables. Vegetables in a cheese sauce. Regular canned vegetables (not low-sodium or reduced-sodium). Regular canned tomato sauce and paste (not low-sodium or reduced-sodium). Regular tomato and vegetable juice (not low-sodium or reduced-sodium). Pickles. Olives. Grains Baked goods made with fat, such as croissants, muffins, or some breads. Dry pasta or rice meal packs. Meats and other proteins Fatty cuts of meat. Ribs. Fried meat. Bacon. Bologna, salami, and other precooked or cured meats, such as sausages or meat loaves. Fat from the back of a pig (fatback).  Bratwurst. Salted nuts and seeds. Canned beans with added salt. Canned or smoked fish. Whole eggs or egg yolks. Chicken or turkey with skin. Dairy Whole or 2% milk, cream, and half-and-half. Whole or full-fat cream cheese. Whole-fat or sweetened yogurt. Full-fat cheese. Nondairy creamers. Whipped toppings. Processed cheese and cheese spreads. Fats and oils Butter. Stick margarine. Lard. Shortening. Ghee. Bacon fat. Tropical oils, such as coconut, palm kernel, or palm oil. Seasonings and condiments Onion salt, garlic salt, seasoned salt, table salt, and sea salt. Worcestershire sauce. Tartar sauce. Barbecue sauce. Teriyaki sauce. Soy sauce, including reduced-sodium. Steak sauce. Canned and packaged gravies. Fish sauce. Oyster sauce. Cocktail sauce. Store-bought horseradish. Ketchup. Mustard. Meat flavorings and tenderizers. Bouillon cubes. Hot sauces. Pre-made or packaged marinades. Pre-made or packaged taco seasonings. Relishes. Regular salad dressings. Other foods Salted popcorn and pretzels. The items listed above may not be a complete list of foods and beverages you should avoid. Contact a dietitian for more information. Where to find more information  National Heart, Lung, and Blood Institute: www.nhlbi.nih.gov  American Heart Association: www.heart.org  Academy of Nutrition and Dietetics: www.eatright.org  National Kidney Foundation: www.kidney.org Summary  The DASH eating plan is a healthy eating plan that has been shown to reduce high   blood pressure (hypertension). It may also reduce your risk for type 2 diabetes, heart disease, and stroke.  When on the DASH eating plan, aim to eat more fresh fruits and vegetables, whole grains, lean proteins, low-fat dairy, and heart-healthy fats.  With the DASH eating plan, you should limit salt (sodium) intake to 2,300 mg a day. If you have hypertension, you may need to reduce your sodium intake to 1,500 mg a day.  Work with your health care  provider or dietitian to adjust your eating plan to your individual calorie needs. This information is not intended to replace advice given to you by your health care provider. Make sure you discuss any questions you have with your health care provider. Document Revised: 10/20/2019 Document Reviewed: 10/20/2019 Elsevier Patient Education  2021 Elsevier Inc.   

## 2021-03-04 NOTE — Telephone Encounter (Signed)
Called to reschedule appt due to Heather Harrell being out sick. Pt is asking if anyone in the office can work her in today or tomorrow for elevated BP 150/92 for last 2 weeks and pt thinks she is having bad headaches due to elevated BP. Please advise. Thanks TNP

## 2021-03-04 NOTE — Progress Notes (Signed)
Established patient visit   Patient: Heather Harrell   DOB: [birth year]   40 y.o. Female  MRN: [id number] Visit Date: 03/04/2021  Today's healthcare provider: Lavon Paganini, MD   Chief Complaint  Patient presents with  . Migraine  . Hypertension  I,Nithya Meriweather,acting as a scribe for Lavon Paganini, MD.,have documented all relevant documentation on the behalf of Lavon Paganini, MD,as directed by  Lavon Paganini, MD while in the presence of Lavon Paganini, MD.  Subjective    HPI  Follow up for Migraine  The patient was last seen for this 1 months ago. Changes made at last visit include continue current medication.  She reports good compliance with treatment. She feels that condition is Worse. She is not having side effects.   ----------------------------------------------------------------------------------------- Elevate Blood pressure Patient reports elevated blood pressure reading for the last 2 weeks now. She denies any chest pain or dizziness but reports a few palpations a few days ago. She reports readings around 130's-150's/90's at home. She reports noticing her migraines worsen since blood pressure has been elevated.   Social History   Tobacco Use  . Smoking status: Current Every Day Smoker    Packs/day: 1.00    Types: Cigarettes  . Smokeless tobacco: Never Used  Substance Use Topics  . Alcohol use: No    Alcohol/week: 0.0 standard drinks  . Drug use: No       Medications: Outpatient Medications Prior to Visit  Medication Sig  . ibuprofen (ADVIL,MOTRIN) 800 MG tablet Take 1 tablet (800 mg total) by mouth every 6 (six) hours as needed.  . IMITREX 50 MG tablet Take 1 tablet by mouth  as needed for migraine, may repeat in 2 hours, max 200mg  per day  . ondansetron (ZOFRAN-ODT) 8 MG disintegrating tablet Take 1 tablet (8 mg total) by mouth every 8 (eight) hours as needed.   No facility-administered medications prior to visit.     Review of Systems  Constitutional: Positive for fatigue.  Respiratory: Negative for chest tightness.   Cardiovascular: Positive for palpitations. Negative for chest pain.  Gastrointestinal: Negative for nausea.  Neurological: Positive for headaches. Negative for dizziness and light-headedness.       Objective    BP (!) 144/93 (BP Location: Left Arm, Patient Position: Sitting, Cuff Size: Normal)   Pulse 93   Temp 98.7 F (37.1 C) (Oral)   Wt 158 lb 1.6 oz (71.7 kg)   SpO2 100%   BMI 28.01 kg/m     Physical Exam Vitals reviewed.  Constitutional:      General: She is not in acute distress.    Appearance: Normal appearance. She is well-developed. She is not diaphoretic.  HENT:     Head: Normocephalic and atraumatic.  Eyes:     General: No scleral icterus.    Conjunctiva/sclera: Conjunctivae normal.  Neck:     Thyroid: No thyromegaly.  Cardiovascular:     Rate and Rhythm: Normal rate and regular rhythm.     Pulses: Normal pulses.     Heart sounds: Normal heart sounds. No murmur heard.   Pulmonary:     Effort: Pulmonary effort is normal. No respiratory distress.     Breath sounds: Normal breath sounds. No wheezing, rhonchi or rales.  Musculoskeletal:     Cervical back: Neck supple.     Right lower leg: No edema.     Left lower leg: No edema.  Lymphadenopathy:     Cervical: No cervical adenopathy.  Skin:  General: Skin is warm and dry.     Findings: No rash.  Neurological:     Mental Status: She is alert and oriented to person, place, and time. Mental status is at baseline.  Psychiatric:        Mood and Affect: Mood normal.        Behavior: Behavior normal.       No results found for any visits on 03/04/21.  Assessment & Plan     Problem List Items Addressed This Visit      Cardiovascular and Mediastinum   Acute onset aura migraine    Longstanding issue, but worsening in frequency and duration over the last few months She has noticed that her  blood pressure has been elevated during this time as well, even on days when she does not have a migraine Continue Imitrex as needed Trial of verapamil for migraine prophylaxis as below Neuro intact and no red flags      Relevant Medications   verapamil (CALAN-SR) 120 MG CR tablet   Primary hypertension - Primary    New diagnosis Consistently elevated home and office BPs Likely contributing to headaches as above For blood pressure control and migraine prophylaxis, will start verapamil 120 mg daily Reviewed last metabolic panel Discussed goal blood pressure of less than 140/90 Discussed monitoring home blood pressures Discussed DASH diet and regular exercise      Relevant Medications   verapamil (CALAN-SR) 120 MG CR tablet     Other   Tobacco use    Discussed importance of cessation precontemplative      Overweight    Discussed importance of healthy weight management Discussed diet and exercise           Return in about 4 weeks (around 04/01/2021) for chronic disease f/u.      I, Lavon Paganini, MD, have reviewed all documentation for this visit. The documentation on 03/04/21 for the exam, diagnosis, procedures, and orders are all accurate and complete.   Jaime Dome, Dionne Bucy, MD, MPH [facility]

## 2021-03-04 NOTE — Assessment & Plan Note (Signed)
Discussed importance of healthy weight management Discussed diet and exercise  

## 2021-03-04 NOTE — Assessment & Plan Note (Signed)
Longstanding issue, but worsening in frequency and duration over the last few months She has noticed that her blood pressure has been elevated during this time as well, even on days when she does not have a migraine Continue Imitrex as needed Trial of verapamil for migraine prophylaxis as below Neuro intact and no red flags

## 2021-03-04 NOTE — Telephone Encounter (Signed)
Dr. B was able to work pt in at 120pm today. Pt advised and appt was scheduled. Thanks TNP

## 2021-03-10 ENCOUNTER — Ambulatory Visit: Payer: 59 | Admitting: Psychology

## 2021-04-03 ENCOUNTER — Ambulatory Visit (INDEPENDENT_AMBULATORY_CARE_PROVIDER_SITE_OTHER): Payer: 59 | Admitting: Psychology

## 2021-04-03 DIAGNOSIS — F902 Attention-deficit hyperactivity disorder, combined type: Secondary | ICD-10-CM

## 2021-04-10 ENCOUNTER — Encounter: Payer: Self-pay | Admitting: Family Medicine

## 2021-04-10 ENCOUNTER — Other Ambulatory Visit: Payer: Self-pay

## 2021-04-10 ENCOUNTER — Ambulatory Visit (INDEPENDENT_AMBULATORY_CARE_PROVIDER_SITE_OTHER): Payer: 59 | Admitting: Family Medicine

## 2021-04-10 VITALS — BP 122/86 | HR 83 | Temp 98.3°F | Resp 16 | Ht 63.0 in | Wt 157.0 lb

## 2021-04-10 DIAGNOSIS — E663 Overweight: Secondary | ICD-10-CM

## 2021-04-10 DIAGNOSIS — F902 Attention-deficit hyperactivity disorder, combined type: Secondary | ICD-10-CM | POA: Diagnosis not present

## 2021-04-10 DIAGNOSIS — Z114 Encounter for screening for human immunodeficiency virus [HIV]: Secondary | ICD-10-CM

## 2021-04-10 DIAGNOSIS — F17200 Nicotine dependence, unspecified, uncomplicated: Secondary | ICD-10-CM

## 2021-04-10 DIAGNOSIS — Z1159 Encounter for screening for other viral diseases: Secondary | ICD-10-CM

## 2021-04-10 DIAGNOSIS — I10 Essential (primary) hypertension: Secondary | ICD-10-CM

## 2021-04-10 MED ORDER — AMPHETAMINE-DEXTROAMPHETAMINE 10 MG PO TABS: 10.0000 mg | ORAL_TABLET | Freq: Two times a day (BID) | ORAL | 0 refills | Status: DC

## 2021-04-10 NOTE — Assessment & Plan Note (Signed)
Discussed importance of healthy weight management Discussed diet and exercise Screening labs today 

## 2021-04-10 NOTE — Assessment & Plan Note (Signed)
New diagnosis Was recently tested by [facility] Documentation will be scanned into her chart She previously took a stimulant in middle school and found it very helpful, but does not remember the name of it We will try Adderall 10 mg twice daily to start She will see me a message in 1 month to let me know how its going or if we need to dose titrate and then we will follow-up in 3 months

## 2021-04-10 NOTE — Progress Notes (Signed)
Established patient visit   Patient: Heather Harrell   DOB: 10/16/1981   40 y.o. Female  MRN: [id number] Visit Date: 04/10/2021  Today's healthcare provider: Lavon Paganini, MD   Chief Complaint  Patient presents with  . Follow-up   Subjective    HPI  She is feel well today.   Blood Pressure She denies having any migraines. She denies taking her verapamil for her hypertension and she has made small improvements with her diet to improve her blood pressure.  ADHD/Anxiety She finished her ADHD evaluation and results were high for anxiety and ADHD. She wants to have her ADHD controlled by august because she is returning back to school. She used to take a stimulate that helped back when she was in middle school. Her psychiatrist told her about vyvanse for a potential option for medication.  Labs  She has had labs completed at [facility] however she says that it has been a while.   Social History  She reports that she is currently still smoking and denies interest in quitting.   Follow up for elevated blood pressure with migraines  The patient was last seen for this 1 months ago.      Changes made at last visit include Continue Imitrex as needed Trial of verapamil.  She reports poor compliance with treatment. Patient reports she did not take verapamil. She feels that condition is Improved. She denies any headaches and reports blood pressure has been stable.  ----------------------------------------------------------------------------------------- Follow up for ADHD  The patient was last seen for this 2 weeks ago by Clarice Pole. Patient was diagnosed with ADHD and anxiety.  -----------------------------------------------------------------------------------------    Patient Active Problem List   Diagnosis Date Noted  . Attention deficit hyperactivity disorder (ADHD), combined type 04/10/2021  . Primary hypertension 03/04/2021  . Overweight 03/04/2021  .  Family history of breast cancer   . Family history of lung cancer   . At high risk for breast cancer 12/29/2019  . Biliary calculi 02/26/2016  . Severe obstructive sleep apnea 02/26/2016  . Compulsive tobacco user syndrome 09/28/2006  . Other iron deficiency anemias 09/22/2006  . Mild dysplasia of cervix (CIN I) 09/22/2006  . Acute onset aura migraine 09/22/2006  . Tobacco use 09/22/2006   Social History   Tobacco Use  . Smoking status: Current Every Day Smoker    Packs/day: 1.00    Types: Cigarettes  . Smokeless tobacco: Never Used  Substance Use Topics  . Alcohol use: No    Alcohol/week: 0.0 standard drinks  . Drug use: No   Allergies  Allergen Reactions  . Ivp Dye [Iodinated Diagnostic Agents] Itching  . Codeine Itching and Hives       Medications: Outpatient Medications Prior to Visit  Medication Sig  . ibuprofen (ADVIL,MOTRIN) 800 MG tablet Take 1 tablet (800 mg total) by mouth every 6 (six) hours as needed.  . IMITREX 50 MG tablet Take 1 tablet by mouth  as needed for migraine, may repeat in 2 hours, max 200mg  per day  . ondansetron (ZOFRAN-ODT) 8 MG disintegrating tablet Take 1 tablet (8 mg total) by mouth every 8 (eight) hours as needed.  . [DISCONTINUED] verapamil (CALAN-SR) 120 MG CR tablet Take 1 tablet (120 mg total) by mouth daily.   No facility-administered medications prior to visit.    Review of Systems  Constitutional: Negative for activity change, appetite change, fatigue and fever.  HENT: Negative for ear pain, nosebleeds, sinus pressure, sinus pain and sore  throat.   Eyes: Negative for pain.  Respiratory: Negative for choking, chest tightness, shortness of breath and wheezing.   Cardiovascular: Negative for chest pain, palpitations and leg swelling.  Gastrointestinal: Negative for abdominal pain, blood in stool, diarrhea, nausea and vomiting.  Genitourinary: Negative for dysuria, flank pain, frequency, pelvic pain and urgency.  Musculoskeletal:  Negative for back pain, neck pain and neck stiffness.  Neurological: Negative for dizziness, syncope, weakness, numbness and headaches.  Psychiatric/Behavioral: Positive for agitation. Negative for decreased concentration. The patient is nervous/anxious.         Objective    BP 122/86 (BP Location: Right Arm, Patient Position: Sitting, Cuff Size: Large)   Pulse 83   Temp 98.3 F (36.8 C) (Oral)   Resp 16   Ht 5\' 3"  (1.6 m)   Wt 157 lb (71.2 kg)   LMP 03/28/2021 (Exact Date)   SpO2 98%   BMI 27.81 kg/m  BP Readings from Last 3 Encounters:  04/10/21 122/86  03/04/21 (!) 144/93  02/06/21 (!) 125/93   Wt Readings from Last 3 Encounters:  04/10/21 157 lb (71.2 kg)  03/04/21 158 lb 1.6 oz (71.7 kg)  02/06/21 159 lb 6.4 oz (72.3 kg)      Physical Exam Vitals reviewed.  Constitutional:      General: She is not in acute distress.    Appearance: Normal appearance. She is well-developed. She is not diaphoretic.  HENT:     Head: Normocephalic and atraumatic.  Eyes:     General: No scleral icterus.    Conjunctiva/sclera: Conjunctivae normal.  Neck:     Thyroid: No thyromegaly.  Cardiovascular:     Rate and Rhythm: Normal rate and regular rhythm.     Pulses: Normal pulses.     Heart sounds: Normal heart sounds. No murmur heard.   Pulmonary:     Effort: Pulmonary effort is normal. No respiratory distress.     Breath sounds: Normal breath sounds. No wheezing, rhonchi or rales.  Musculoskeletal:     Cervical back: Neck supple.     Right lower leg: No edema.     Left lower leg: No edema.  Lymphadenopathy:     Cervical: No cervical adenopathy.  Skin:    General: Skin is warm and dry.     Findings: No rash.  Neurological:     Mental Status: She is alert and oriented to person, place, and time. Mental status is at baseline.  Psychiatric:        Mood and Affect: Mood normal.        Behavior: Behavior normal.     No results found for any visits on 04/10/21.  Assessment  & Plan     Problem List Items Addressed This Visit      Cardiovascular and Mediastinum   Primary hypertension    Now well controlled with diet control and no medications Recheck labs today Continue to monitor at home as we are starting stimulant as below      Relevant Orders   Comprehensive metabolic panel   Lipid panel     Other   Compulsive tobacco user syndrome    Discussed cessation Patient remains precontemplative Continue to reassess      Overweight    Discussed importance of healthy weight management Discussed diet and exercise Screening labs today      Relevant Orders   Comprehensive metabolic panel   Lipid panel   Attention deficit hyperactivity disorder (ADHD), combined type - Primary    New  diagnosis Was recently tested by [facility] Documentation will be scanned into her chart She previously took a stimulant in middle school and found it very helpful, but does not remember the name of it We will try Adderall 10 mg twice daily to start She will see me a message in 1 month to let me know how its going or if we need to dose titrate and then we will follow-up in 3 months       Other Visit Diagnoses    Need for hepatitis C screening test       Relevant Orders   Hepatitis C Antibody   Encounter for screening for HIV       Relevant Orders   HIV antibody (with reflex)       Return in about 3 months (around 07/11/2021) for ADD f/u, chronic disease f/u.       I,Essence Turner,acting as a Education administrator for Lavon Paganini, MD.,have documented all relevant documentation on the behalf of Lavon Paganini, MD,as directed by  Lavon Paganini, MD while in the presence of Lavon Paganini, MD.  I, Lavon Paganini, MD, have reviewed all documentation for this visit. The documentation on 04/10/21 for the exam, diagnosis, procedures, and orders are all accurate and complete.   Alyvia Derk, Dionne Bucy, MD, MPH [facility]

## 2021-04-10 NOTE — Patient Instructions (Signed)

## 2021-04-10 NOTE — Assessment & Plan Note (Signed)
Discussed cessation Patient remains precontemplative Continue to reassess

## 2021-04-10 NOTE — Assessment & Plan Note (Signed)
Now well controlled with diet control and no medications Recheck labs today Continue to monitor at home as we are starting stimulant as below

## 2021-04-11 LAB — COMPREHENSIVE METABOLIC PANEL
ALT: 12 IU/L (ref 0–32)
AST: 14 IU/L (ref 0–40)
Albumin/Globulin Ratio: 2.2 (ref 1.2–2.2)
Albumin: 4.7 g/dL (ref 3.8–4.8)
Alkaline Phosphatase: 82 IU/L (ref 44–121)
BUN/Creatinine Ratio: 12 (ref 9–23)
BUN: 11 mg/dL (ref 6–24)
Bilirubin Total: 0.7 mg/dL (ref 0.0–1.2)
CO2: 23 mmol/L (ref 20–29)
Calcium: 9.7 mg/dL (ref 8.7–10.2)
Chloride: 101 mmol/L (ref 96–106)
Creatinine, Ser: 0.9 mg/dL (ref 0.57–1.00)
Globulin, Total: 2.1 g/dL (ref 1.5–4.5)
Glucose: 80 mg/dL (ref 65–99)
Potassium: 4.3 mmol/L (ref 3.5–5.2)
Sodium: 138 mmol/L (ref 134–144)
Total Protein: 6.8 g/dL (ref 6.0–8.5)
eGFR: 83 mL/min/{1.73_m2} (ref >59)

## 2021-04-11 LAB — LIPID PANEL
Chol/HDL Ratio: 2.8 ratio (ref 0.0–4.4)
Cholesterol, Total: 172 mg/dL (ref 100–199)
HDL: 61 mg/dL (ref >39)
LDL Chol Calc (NIH): 100 mg/dL — ABNORMAL HIGH (ref 0–99)
Triglycerides: 56 mg/dL (ref 0–149)
VLDL Cholesterol Cal: 11 mg/dL (ref 5–40)

## 2021-04-11 LAB — HEPATITIS C ANTIBODY: Hep C Virus Ab: <0.1 {s_co_ratio} (ref 0.0–0.9)

## 2021-04-11 LAB — HIV ANTIBODY (ROUTINE TESTING W REFLEX): HIV Screen 4th Generation wRfx: NONREACTIVE

## 2021-05-02 ENCOUNTER — Encounter: Payer: Self-pay | Admitting: Family Medicine

## 2021-05-02 DIAGNOSIS — F902 Attention-deficit hyperactivity disorder, combined type: Secondary | ICD-10-CM

## 2021-05-02 MED ORDER — AMPHETAMINE-DEXTROAMPHETAMINE 10 MG PO TABS: 10.0000 mg | ORAL_TABLET | Freq: Two times a day (BID) | ORAL | 0 refills | Status: DC

## 2021-05-28 ENCOUNTER — Encounter: Payer: Self-pay | Admitting: Family Medicine

## 2021-05-28 DIAGNOSIS — F902 Attention-deficit hyperactivity disorder, combined type: Secondary | ICD-10-CM

## 2021-05-30 NOTE — Telephone Encounter (Signed)
Please review refill. Thanks!

## 2021-06-06 MED ORDER — AMPHETAMINE-DEXTROAMPHETAMINE 10 MG PO TABS: 10.0000 mg | ORAL_TABLET | Freq: Two times a day (BID) | ORAL | 0 refills | Status: DC

## 2021-07-11 ENCOUNTER — Encounter: Payer: Self-pay | Admitting: Family Medicine

## 2021-07-11 ENCOUNTER — Other Ambulatory Visit: Payer: Self-pay

## 2021-07-11 ENCOUNTER — Ambulatory Visit (INDEPENDENT_AMBULATORY_CARE_PROVIDER_SITE_OTHER): Payer: 59 | Admitting: Family Medicine

## 2021-07-11 VITALS — BP 126/88 | HR 83 | Temp 98.3°F | Resp 16 | Ht 63.0 in | Wt 142.7 lb

## 2021-07-11 DIAGNOSIS — E162 Hypoglycemia, unspecified: Secondary | ICD-10-CM

## 2021-07-11 DIAGNOSIS — I10 Essential (primary) hypertension: Secondary | ICD-10-CM | POA: Diagnosis not present

## 2021-07-11 DIAGNOSIS — F902 Attention-deficit hyperactivity disorder, combined type: Secondary | ICD-10-CM

## 2021-07-11 DIAGNOSIS — G43809 Other migraine, not intractable, without status migrainosus: Secondary | ICD-10-CM

## 2021-07-11 DIAGNOSIS — E663 Overweight: Secondary | ICD-10-CM

## 2021-07-11 MED ORDER — IMITREX 50 MG PO TABS: ORAL_TABLET | ORAL | 1 refills | Status: DC

## 2021-07-11 MED ORDER — ACCU-CHEK NANO SMARTVIEW W/DEVICE KIT: PACK | 0 refills | Status: AC

## 2021-07-11 MED ORDER — AMPHETAMINE-DEXTROAMPHETAMINE 10 MG PO TABS: 10.0000 mg | ORAL_TABLET | Freq: Two times a day (BID) | ORAL | 0 refills | Status: DC

## 2021-07-11 MED ORDER — ONDANSETRON 8 MG PO TBDP: 8.0000 mg | ORAL_TABLET | Freq: Three times a day (TID) | ORAL | 0 refills | Status: DC | PRN

## 2021-07-11 MED ORDER — PROGESTERONE 200 MG PO CAPS: 200.0000 mg | ORAL_CAPSULE | Freq: Every day | ORAL | 0 refills | Status: DC

## 2021-07-11 MED ORDER — ACCU-CHEK SMARTVIEW VI STRP: ORAL_STRIP | 1 refills | Status: AC

## 2021-07-11 MED ORDER — ACCU-CHEK SOFTCLIX LANCETS MISC: 1 refills | Status: AC

## 2021-07-11 NOTE — Assessment & Plan Note (Signed)
Chronic and well controlled Continue adderall at current dose Refills given x4 F/u in 22m

## 2021-07-11 NOTE — Assessment & Plan Note (Addendum)
Congratulated on weight loss ?Discussed importance of healthy weight management ?Discussed diet and exercise  ?

## 2021-07-11 NOTE — Assessment & Plan Note (Addendum)
Discussed avoiding fasting for long periods of time Check A1c with last labs Glucometer Rx given today

## 2021-07-11 NOTE — Assessment & Plan Note (Signed)
Fairly well controlled, but stress driven

## 2021-07-11 NOTE — Progress Notes (Signed)
Established patient visit   Patient: Heather Harrell   DOB: [birth year]   40 y.o. Female  MRN: [id number] Visit Date: 07/11/2021  Today's healthcare provider: Lavon Paganini, MD   Chief Complaint  Patient presents with   ADHD   Hypertension   Migraine   Hypoglycemia   Subjective    Hypertension Associated symptoms include headaches. Pertinent negatives include no chest pain, neck pain, palpitations or shortness of breath.  Migraine  Associated symptoms include dizziness. Pertinent negatives include no abdominal pain, back pain, coughing, ear pain, eye pain, fever, nausea, neck pain, numbness, sinus pressure, sore throat, vomiting or weakness. Her past medical history is significant for hypertension.  Hypoglycemia Associated symptoms include headaches. Pertinent negatives include no abdominal pain, chest pain, chills, coughing, fatigue, fever, myalgias, nausea, neck pain, numbness, sore throat, vomiting or weakness.   Follow up for ADHD  The patient was last seen for this 3 months ago.  Changes made at last visit include We will try Adderall 10 mg twice daily to start She will see me a message in 1 month to let me know how its going or if we need to dose titrate and then we will follow-up in 3 months.  She is tolerating adderal and she reports it is helpful. She is going back to school and requesting for refills.  She reports excellent compliance with treatment. She feels that condition is Improved. She is not having side effects.   ----------------------------------------------------------------------------------------- Follow up for hypertension  The patient was last seen for this 3 months ago.  Changes made at last visit include no changes, well controlled with diet and no medications.  BP Readings from Last 3 Encounters:  07/11/21 126/88  04/10/21 122/86  03/04/21 (!) 144/93    She feels that condition is  Improved.  ----------------------------------------------------------------------------------------- Follow up for migraine  The patient was last seen for this 3 months ago. Changes made at last visit include no changes.  She reports excellent compliance with treatment. She feels that condition is Improved. She is not having side effects.   Her last migraine lasted for about 9 days and it was stress related. She takes 50 mg imitrex along with tylenol and ibuprofen to improve her symptoms.   -----------------------------------------------------------------------------------------  Hypoglycemia  She C/O hypoglycemia, she reports this has ben an ongoing problem for several years. There are times at night that she wakes up in sweats because of her low sugar. She reports using her mothers glucose meter to check levels. She reports yesterday her sugar dropped to 58. She reports dizziness, light-headedness, confusion and decrease concentration. She is requesting a glucose monitor.    She is getting married September 10th. She is requesting for a progesterone pill to potentially skip her period until after her wedding/honeymoon.   Patient Active Problem List   Diagnosis Date Noted   Hypoglycemia 07/11/2021   Attention deficit hyperactivity disorder (ADHD), combined type 04/10/2021   Primary hypertension 03/04/2021   Overweight 03/04/2021   Family history of breast cancer    Family history of lung cancer    At high risk for breast cancer 12/29/2019   Biliary calculi 02/26/2016   Severe obstructive sleep apnea 02/26/2016   Compulsive tobacco user syndrome 09/28/2006   Other iron deficiency anemias 09/22/2006   Mild dysplasia of cervix (CIN I) 09/22/2006   Migraine 09/22/2006   Tobacco use 09/22/2006   Social History   Tobacco Use   Smoking status: Every Day    Packs/day: 1.00  Types: Cigarettes   Smokeless tobacco: Never  Substance Use Topics   Alcohol use: No    Alcohol/week:  0.0 standard drinks   Drug use: No   Allergies  Allergen Reactions   Ivp Dye [Iodinated Diagnostic Agents] Itching   Codeine Itching and Hives     Medications: Outpatient Medications Prior to Visit  Medication Sig   ibuprofen (ADVIL,MOTRIN) 800 MG tablet Take 1 tablet (800 mg total) by mouth every 6 (six) hours as needed.   [DISCONTINUED] amphetamine-dextroamphetamine (ADDERALL) 10 MG tablet Take 1 tablet (10 mg total) by mouth 2 (two) times daily.   [DISCONTINUED] IMITREX 50 MG tablet Take 1 tablet by mouth  as needed for migraine, may repeat in 2 hours, max '200mg'$  per day   [DISCONTINUED] ondansetron (ZOFRAN-ODT) 8 MG disintegrating tablet Take 1 tablet (8 mg total) by mouth every 8 (eight) hours as needed.   No facility-administered medications prior to visit.    Review of Systems  Constitutional:  Negative for activity change, appetite change, chills, fatigue, fever and unexpected weight change.  HENT:  Negative for ear pain, sinus pressure, sinus pain and sore throat.   Eyes:  Negative for pain and visual disturbance.  Respiratory:  Negative for cough, chest tightness, shortness of breath and wheezing.   Cardiovascular:  Negative for chest pain, palpitations and leg swelling.  Gastrointestinal:  Negative for abdominal pain, blood in stool, diarrhea, nausea and vomiting.  Genitourinary:  Negative for dysuria, flank pain, frequency, pelvic pain and urgency.  Musculoskeletal:  Negative for back pain, myalgias and neck pain.  Neurological:  Positive for dizziness, light-headedness and headaches. Negative for syncope, weakness and numbness.  Psychiatric/Behavioral:  Positive for confusion and decreased concentration. The patient is nervous/anxious.        Objective    BP 126/88 (BP Location: Left Arm, Patient Position: Sitting, Cuff Size: Normal)   Pulse 83   Temp 98.3 F (36.8 C) (Oral)   Resp 16   Ht '5\' 3"'$  (1.6 m)   Wt 142 lb 11.2 oz (64.7 kg)   LMP 07/09/2021 (Exact Date)    SpO2 100%   BMI 25.28 kg/m  BP Readings from Last 3 Encounters:  07/11/21 126/88  04/10/21 122/86  03/04/21 (!) 144/93   Wt Readings from Last 3 Encounters:  07/11/21 142 lb 11.2 oz (64.7 kg)  04/10/21 157 lb (71.2 kg)  03/04/21 158 lb 1.6 oz (71.7 kg)     Physical Exam Vitals reviewed.  Constitutional:      General: She is not in acute distress.    Appearance: Normal appearance. She is well-developed. She is not diaphoretic.  HENT:     Head: Normocephalic and atraumatic.  Eyes:     General: No scleral icterus.    Conjunctiva/sclera: Conjunctivae normal.  Neck:     Thyroid: No thyromegaly.  Cardiovascular:     Rate and Rhythm: Normal rate and regular rhythm.     Pulses: Normal pulses.     Heart sounds: Normal heart sounds. No murmur heard. Pulmonary:     Effort: Pulmonary effort is normal. No respiratory distress.     Breath sounds: Normal breath sounds. No wheezing, rhonchi or rales.  Musculoskeletal:     Cervical back: Neck supple.     Right lower leg: No edema.     Left lower leg: No edema.  Lymphadenopathy:     Cervical: No cervical adenopathy.  Skin:    General: Skin is warm and dry.     Findings: No  rash.  Neurological:     Mental Status: She is alert and oriented to person, place, and time. Mental status is at baseline.  Psychiatric:        Mood and Affect: Mood normal.        Behavior: Behavior normal.     No results found for any visits on 07/11/21.  Assessment & Plan     Problem List Items Addressed This Visit       Cardiovascular and Mediastinum   Migraine    Fairly well controlled, but stress driven      Relevant Medications   ondansetron (ZOFRAN-ODT) 8 MG disintegrating tablet   IMITREX 50 MG tablet   Primary hypertension    Well controlled today Not on meds -lifestyle control        Endocrine   Hypoglycemia    Discussed avoiding fasting for long periods of time Check A1c with last labs Glucometer Rx given today        Other    Overweight    Congratulated on weight loss Discussed importance of healthy weight management Discussed diet and exercise       Attention deficit hyperactivity disorder (ADHD), combined type - Primary    Chronic and well controlled Continue adderall at current dose Refills given x4 F/u in 26m     Relevant Medications   amphetamine-dextroamphetamine (ADDERALL) 10 MG tablet    Return in about 4 months (around 11/10/2021) for chronic disease f/u.      I,Essence Turner,acting as a sEducation administratorfor ALavon Paganini MD.,have documented all relevant documentation on the behalf of ALavon Paganini MD,as directed by  ALavon Paganini MD while in the presence of ALavon Paganini MD.  I, ALavon Paganini MD, have reviewed all documentation for this visit. The documentation on 07/11/21 for the exam, diagnosis, procedures, and orders are all accurate and complete.   Marjo Grosvenor, ADionne Bucy MD, MPH BLittle RiverGroup

## 2021-07-11 NOTE — Assessment & Plan Note (Signed)
Well controlled today Not on meds -lifestyle control

## 2021-07-14 ENCOUNTER — Other Ambulatory Visit: Payer: Self-pay | Admitting: Family Medicine

## 2021-07-14 DIAGNOSIS — G43809 Other migraine, not intractable, without status migrainosus: Secondary | ICD-10-CM

## 2021-07-14 NOTE — Telephone Encounter (Signed)
Requested medication (s) are due for refill today:   Yes  Requested medication (s) are on the active medication list:   Yes  Future visit scheduled:   Yes   Seen 3 days ago   Last ordered: 07/11/2021 #10, 1 refill  Returned because the brand is not covered by insurance.  Requesting a generic.   Requested Prescriptions  Pending Prescriptions Disp Refills   IMITREX 50 MG tablet [Pharmacy Med Name: IMITREX '50MG'$         TAB] 10 tablet 1    Sig: TAKE 1 TABLET BY MOUTH AS NEEDED FOR  MIGRAINE,  MAY  REPEAT  IN  2  HOURS  ,  MAX  200  MG  PER  DAY     Neurology:  Migraine Therapy - Triptan Passed - 07/14/2021  3:01 PM      Passed - Last BP in normal range    BP Readings from Last 1 Encounters:  07/11/21 126/88          Passed - Valid encounter within last 12 months    Recent Outpatient Visits           3 days ago Attention deficit hyperactivity disorder (ADHD), combined type   [facility], Dionne Bucy, MD   3 months ago Attention deficit hyperactivity disorder (ADHD), combined type   [facility] [place], Dionne Bucy, MD   4 months ago Primary hypertension   [facility] [place], Dionne Bucy, MD   5 months ago Other migraine without status migrainosus, not intractable   [facility], [place]       Future Appointments             In 1 month Ralene Bathe, MD [place]   In 3 months Bacigalupo, Dionne Bucy, MD [facility], [place]   In 8 months Bacigalupo, Dionne Bucy, MD [facility], [place]

## 2021-08-15 ENCOUNTER — Encounter: Payer: Self-pay | Admitting: Family Medicine

## 2021-08-19 ENCOUNTER — Encounter (INDEPENDENT_AMBULATORY_CARE_PROVIDER_SITE_OTHER): Payer: 59 | Admitting: Family Medicine

## 2021-08-19 ENCOUNTER — Telehealth: Payer: Self-pay

## 2021-08-19 DIAGNOSIS — F988 Other specified behavioral and emotional disorders with onset usually occurring in childhood and adolescence: Secondary | ICD-10-CM

## 2021-08-19 NOTE — Progress Notes (Signed)
Patient made appointment with me to request increasing dose of Adderall. She was advised by CMA via telephone message that she would need to discuss this with Dr. Brita Romp who is managing her ADD and is the usual  prescriber of her Adderall. She already has prescriptions for her current dose which she can continue until she is able to get in touch with Dr. Jacinto Reap

## 2021-08-19 NOTE — Telephone Encounter (Signed)
Copied from [place] [id number]. Topic: General - Other >> Aug 19, 2021 11:48 AM Celene Kras wrote: Reason for CRM: Pt returning call to pre chart. Please advise.

## 2021-08-19 NOTE — Progress Notes (Deleted)
MyChart Video Visit    Virtual Visit via Video Note   This visit type was conducted due to national recommendations for restrictions regarding the COVID-19 Pandemic (e.g. social distancing) in an effort to limit this patient's exposure and mitigate transmission in our community. This patient is at least at moderate risk for complications without adequate follow up. This format is felt to be most appropriate for this patient at this time. Physical exam was limited by quality of the video and audio technology used for the visit.   Patient location: *** Provider location: ***  I discussed the limitations of evaluation and management by telemedicine and the availability of in person appointments. The patient expressed understanding and agreed to proceed.  Patient: Heather Harrell   DOB: 1981-03-26   40 y.o. Female  MRN: [id number] Visit Date: 08/19/2021  Today's healthcare provider: Lelon Huh, MD   Chief Complaint  Patient presents with   ADHD   Subjective    HPI  Follow up for ADHD:  The patient was last seen for this 1 months ago (seen by Dr. Jacinto Reap). Changes made at last visit include none; refill given x 4 of Adderall 44m daily .  She reports {excellent/good/fair/poor:19665} compliance with treatment. She feels that condition is {improved/worse/unchanged:3041574}. She {is/is not:21021397} having side effects. ***  -----------------------------------------------------------------------------------------    Medications: Outpatient Medications Prior to Visit  Medication Sig   Accu-Chek Softclix Lancets lancets Use as directed   amphetamine-dextroamphetamine (ADDERALL) 10 MG tablet Take 1 tablet (10 mg total) by mouth 2 (two) times daily.   amphetamine-dextroamphetamine (ADDERALL) 10 MG tablet Take 1 tablet (10 mg total) by mouth 2 (two) times daily with a meal.   amphetamine-dextroamphetamine (ADDERALL) 10 MG tablet Take 1 tablet (10 mg total) by mouth 2 (two) times  daily with a meal.   amphetamine-dextroamphetamine (ADDERALL) 10 MG tablet Take 1 tablet (10 mg total) by mouth 2 (two) times daily with a meal.   Blood Glucose Monitoring Suppl (ACCU-CHEK NANO SMARTVIEW) w/Device KIT Use as directed   glucose blood (ACCU-CHEK SMARTVIEW) test strip Use as instructed   ibuprofen (ADVIL,MOTRIN) 800 MG tablet Take 1 tablet (800 mg total) by mouth every 6 (six) hours as needed.   IMITREX 50 MG tablet Take 1 tablet by mouth  as needed for migraine, may repeat in 2 hours, max 2028mper day   ondansetron (ZOFRAN-ODT) 8 MG disintegrating tablet Take 1 tablet (8 mg total) by mouth every 8 (eight) hours as needed.   progesterone (PROMETRIUM) 200 MG capsule Take 1 capsule (200 mg total) by mouth daily for 12 days.   No facility-administered medications prior to visit.    Review of Systems  Constitutional:  Negative for appetite change, chills, fatigue and fever.  Respiratory:  Negative for chest tightness and shortness of breath.   Cardiovascular:  Negative for chest pain and palpitations.  Gastrointestinal:  Negative for abdominal pain, nausea and vomiting.  Neurological:  Negative for dizziness and weakness.   {Labs  Heme  Chem  Endocrine  Serology  Results Review (optional):23779}  Objective    There were no vitals taken for this visit. {Show previous vital signs (optional):23777}  Physical Exam     Assessment & Plan     ***  No follow-ups on file.     I discussed the assessment and treatment plan with the patient. The patient was provided an opportunity to ask questions and all were answered. The patient agreed with the plan and demonstrated an  understanding of the instructions.   The patient was advised to call back or seek an in-person evaluation if the symptoms worsen or if the condition fails to improve as anticipated.  I provided *** minutes of non-face-to-face time during this encounter.  {provider attestation***:1}  Lelon Huh,  MD [facility] [phone] (phone) [phone] (fax)  [place]

## 2021-08-19 NOTE — Telephone Encounter (Signed)
Patient was placed on Dr. Maralyn Sago schedule to discuss changing/ increasing the dose of Adderall. Per Dr. Caryn Section, patient needs to be seen by Dr. B for changes made to this medication. I called and advised patient and she verbalized understanding. Patient would like an appointment to see Dr. B within the next couple of weeks to discuss increasing the dose of Adderall. There are no open appointments on Dr. Sharmaine Base schedule. Please review schedule and advise patient on when Dr. B will be able to see her for this. Patient is fine with waiting until Dr. B returns next week for a return call.

## 2021-08-21 ENCOUNTER — Other Ambulatory Visit: Payer: Self-pay | Admitting: Family Medicine

## 2021-08-25 ENCOUNTER — Other Ambulatory Visit: Payer: Self-pay

## 2021-08-25 ENCOUNTER — Ambulatory Visit (INDEPENDENT_AMBULATORY_CARE_PROVIDER_SITE_OTHER): Payer: 59 | Admitting: Dermatology

## 2021-08-25 DIAGNOSIS — D229 Melanocytic nevi, unspecified: Secondary | ICD-10-CM

## 2021-08-25 DIAGNOSIS — D492 Neoplasm of unspecified behavior of bone, soft tissue, and skin: Secondary | ICD-10-CM

## 2021-08-25 DIAGNOSIS — D224 Melanocytic nevi of scalp and neck: Secondary | ICD-10-CM | POA: Diagnosis not present

## 2021-08-25 NOTE — Telephone Encounter (Signed)
Agree that she should be seen. Ok to be with me or another provider if my schedule is full.

## 2021-08-25 NOTE — Patient Instructions (Addendum)
Wound Care Instructions  Cleanse wound gently with soap and water once a day then pat dry with clean gauze. Apply a thing coat of Petrolatum (petroleum jelly, "Vaseline") over the wound (unless you have an allergy to this). We recommend that you use a new, sterile tube of Vaseline. Do not pick or remove scabs. Do not remove the yellow or white "healing tissue" from the base of the wound.  Cover the wound with fresh, clean, nonstick gauze and secure with paper tape. You may use Band-Aids in place of gauze and tape if the would is small enough, but would recommend trimming much of the tape off as there is often too much. Sometimes Band-Aids can irritate the skin.  You should call the office for your biopsy report after 1 week if you have not already been contacted.  If you experience any problems, such as abnormal amounts of bleeding, swelling, significant bruising, significant pain, or evidence of infection, please call the office immediately.  FOR ADULT SURGERY PATIENTS: If you need something for pain relief you may take 1 extra strength Tylenol (acetaminophen) AND 2 Ibuprofen (200mg each) together every 4 hours as needed for pain. (do not take these if you are allergic to them or if you have a reason you should not take them.) Typically, you may only need pain medication for 1 to 3 days.   If you have any questions or concerns for your doctor, please call our main line at [phone] and press option 4 to reach your doctor's medical assistant. If no one answers, please leave a voicemail as directed and we will return your call as soon as possible. Messages left after 4 pm will be answered the following business day.   You may also send us a message via MyChart. We typically respond to MyChart messages within 1-2 business days.  For prescription refills, please ask your pharmacy to contact our office. Our fax number is [phone].  If you have an urgent issue when the clinic is closed that  cannot wait until the next business day, you can page your doctor at the number below.    Please note that while we do our best to be available for urgent issues outside of office hours, we are not available 24/7.   If you have an urgent issue and are unable to reach us, you may choose to seek medical care at your doctor's office, retail clinic, urgent care center, or emergency room.  If you have a medical emergency, please immediately call 911 or go to the emergency department.  Pager Numbers  - Dr. Kowalski: [phone]  - Dr. Moye: [phone]  - Dr. Stewart: [phone]  In the event of inclement weather, please call our main line at [phone] for an update on the status of any delays or closures.  Dermatology Medication Tips: Please keep the boxes that topical medications come in in order to help keep track of the instructions about where and how to use these. Pharmacies typically print the medication instructions only on the boxes and not directly on the medication tubes.   If your medication is too expensive, please contact our office at [phone] option 4 or send us a message through MyChart.   We are unable to tell what your co-pay for medications will be in advance as this is different depending on your insurance coverage. However, we may be able to find a substitute medication at lower cost or fill out paperwork to get insurance to cover a needed   medication.   If a prior authorization is required to get your medication covered by your insurance company, please allow us 1-2 business days to complete this process.  Drug prices often vary depending on where the prescription is filled and some pharmacies may offer cheaper prices.  The website www.goodrx.com contains coupons for medications through different pharmacies. The prices here do not account for what the cost may be with help from insurance (it may be cheaper with your insurance), but the website can give you the  price if you did not use any insurance.  - You can print the associated coupon and take it with your prescription to the pharmacy.  - You may also stop by our office during regular business hours and pick up a GoodRx coupon card.  - If you need your prescription sent electronically to a different pharmacy, notify our office through  MyChart or by phone at [phone] option 4.   

## 2021-08-25 NOTE — Progress Notes (Signed)
   New Patient Visit  Subjective  Heather Harrell is a 40 y.o. female who presents for the following: Skin Problem (Growth on the left posterior neck, growing and changing color and symptomatic.  Pt would like removed).  The following portions of the chart were reviewed this encounter and updated as appropriate:   Tobacco  Allergies  Meds  Problems  Med Hx  Surg Hx  Fam Hx     Review of Systems:  No other skin or systemic complaints except as noted in HPI or Assessment and Plan.  Objective  Well appearing patient in no apparent distress; mood and affect are within normal limits.  A focused examination was performed including face,nelc. Relevant physical exam findings are noted in the Assessment and Plan.  left base of neck superior 0.6 cm tan irregular papule   left base of neck inferior 0.8 cm tan irregular papule    Assessment & Plan  Neoplasm of skin (2) left base of neck superior  Epidermal / dermal shaving  Lesion diameter (cm):  0.6 Informed consent: discussed and consent obtained   Timeout: patient name, date of birth, surgical site, and procedure verified   Procedure prep:  Patient was prepped and draped in usual sterile fashion Prep type:  Isopropyl alcohol Anesthesia: the lesion was anesthetized in a standard fashion   Anesthetic:  1% lidocaine w/ epinephrine 1-100,000 buffered w/ 8.4% NaHCO3 Hemostasis achieved with: pressure, aluminum chloride and electrodesiccation   Outcome: patient tolerated procedure well   Post-procedure details: sterile dressing applied and wound care instructions given   Dressing type: bandage and petrolatum    Specimen 1 - Surgical pathology Differential Diagnosis: R/O Dysplastic nevus   Check Margins: No  left base of neck inferior  Epidermal / dermal shaving  Lesion diameter (cm):  1.1 Informed consent: discussed and consent obtained   Timeout: patient name, date of birth, surgical site, and procedure verified    Procedure prep:  Patient was prepped and draped in usual sterile fashion Prep type:  Isopropyl alcohol Anesthesia: the lesion was anesthetized in a standard fashion   Anesthetic:  1% lidocaine w/ epinephrine 1-100,000 buffered w/ 8.4% NaHCO3 Hemostasis achieved with: pressure, aluminum chloride and electrodesiccation   Outcome: patient tolerated procedure well   Post-procedure details: sterile dressing applied and wound care instructions given   Dressing type: bandage and petrolatum    Specimen 2 - Surgical pathology Differential Diagnosis: R/O Dysplastic nevus   Check Margins: No  Melanocytic Nevi - Tan-brown and/or pink-flesh-colored symmetric macules and papules - Benign appearing on exam today - Observation - Call clinic for new or changing moles - Recommend daily use of broad spectrum spf 30+ sunscreen to sun-exposed areas.   Return if symptoms worsen or fail to improve.  IMarye Round, CMA, am acting as scribe for Sarina Ser, MD .  Documentation: I have reviewed the above documentation for accuracy and completeness, and I agree with the above.  Sarina Ser, MD

## 2021-08-27 ENCOUNTER — Encounter: Payer: Self-pay | Admitting: General Surgery

## 2021-08-28 ENCOUNTER — Encounter: Payer: Self-pay | Admitting: Dermatology

## 2021-08-29 ENCOUNTER — Telehealth: Payer: Self-pay

## 2021-08-29 NOTE — Telephone Encounter (Signed)
-----   Message from Ralene Bathe, MD sent at 08/28/2021  5:28 PM EDT ----- Diagnosis 1. Skin , left base of neck superior MELANOCYTIC NEVUS, INTRADERMAL TYPE, IRRITATED 2. Skin , left base of neck inferior MELANOCYTIC NEVUS, INTRADERMAL TYPE, IRRITATED  1&2 - both benign irritated moles No further treatment needed

## 2021-08-29 NOTE — Telephone Encounter (Signed)
Left message on voicemail to return my call.  

## 2021-09-01 ENCOUNTER — Ambulatory Visit: Payer: Medicaid Other | Admitting: Dermatology

## 2021-09-03 ENCOUNTER — Encounter: Payer: Self-pay | Admitting: Family Medicine

## 2021-09-03 ENCOUNTER — Ambulatory Visit (INDEPENDENT_AMBULATORY_CARE_PROVIDER_SITE_OTHER): Payer: 59 | Admitting: Family Medicine

## 2021-09-03 ENCOUNTER — Other Ambulatory Visit: Payer: Self-pay

## 2021-09-03 VITALS — BP 131/86 | HR 79 | Temp 98.3°F | Resp 16 | Wt 146.1 lb

## 2021-09-03 DIAGNOSIS — F902 Attention-deficit hyperactivity disorder, combined type: Secondary | ICD-10-CM | POA: Diagnosis not present

## 2021-09-03 DIAGNOSIS — G43809 Other migraine, not intractable, without status migrainosus: Secondary | ICD-10-CM

## 2021-09-03 MED ORDER — AMPHETAMINE-DEXTROAMPHETAMINE 20 MG PO TABS: 20.0000 mg | ORAL_TABLET | Freq: Two times a day (BID) | ORAL | 0 refills | Status: DC

## 2021-09-03 MED ORDER — SUMATRIPTAN SUCCINATE 50 MG PO TABS: 50.0000 mg | ORAL_TABLET | ORAL | 0 refills | Status: DC | PRN

## 2021-09-03 NOTE — Assessment & Plan Note (Signed)
Pulled for medication refill; requires generic medication

## 2021-09-03 NOTE — Assessment & Plan Note (Signed)
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

## 2021-09-03 NOTE — Progress Notes (Signed)
Established patient visit   Patient: Heather Harrell   DOB: [birth year]   40 y.o. Female  MRN: [id number] Visit Date: 09/03/2021  Today's healthcare provider: Gwyneth Sprout, FNP   Chief Complaint  Patient presents with   ADHD   Subjective    HPI  Follow up for ADHD  The patient was last seen for this 2 months ago. Changes made at last visit include none continue Adderall at current dose. Patient states that she would like to increase dosage today stating that medication is not controlling symptoms.  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  She reports fair compliance with treatment. She feels that condition is Worse. She is not having side effects.   -----------------------------------------------------------------------------------------   Medications: Outpatient Medications Prior to Visit  Medication Sig   Accu-Chek Softclix Lancets lancets Use as directed   Blood Glucose Monitoring Suppl (ACCU-CHEK NANO SMARTVIEW) w/Device KIT Use as directed   glucose blood (ACCU-CHEK SMARTVIEW) test strip Use as instructed   ibuprofen (ADVIL,MOTRIN) 800 MG tablet Take 1 tablet (800 mg total) by mouth every 6 (six) hours as needed.   ondansetron (ZOFRAN-ODT) 8 MG disintegrating tablet Take 1 tablet (8 mg total) by mouth every 8 (eight) hours as needed.   [DISCONTINUED] amphetamine-dextroamphetamine (ADDERALL) 10 MG tablet Take 1 tablet (10 mg total) by mouth 2 (two) times daily.   [DISCONTINUED] amphetamine-dextroamphetamine (ADDERALL) 10 MG tablet Take 1 tablet (10 mg total) by mouth 2 (two) times daily with a meal.   [DISCONTINUED] amphetamine-dextroamphetamine (ADDERALL) 10 MG tablet Take 1 tablet (10 mg total) by mouth 2 (two) times daily with a meal.   [DISCONTINUED] amphetamine-dextroamphetamine (ADDERALL) 10 MG tablet Take 1 tablet (10 mg total) by mouth 2 (two) times daily with a meal.    [DISCONTINUED] IMITREX 50 MG tablet Take 1 tablet by mouth  as needed for migraine, may repeat in 2 hours, max 212m per day   progesterone (PROMETRIUM) 200 MG capsule Take 1 capsule (200 mg total) by mouth daily for 12 days.   No facility-administered medications prior to visit.    Review of Systems     Objective    BP 131/86   Pulse 79   Temp 98.3 F (36.8 C) (Oral)   Resp 16   Wt 146 lb 1.6 oz (66.3 kg)   BMI 25.88 kg/m  {Show previous vital signs (optional):23777}  Physical Exam Vitals and nursing note reviewed.  Constitutional:      General: She is not in acute distress.    Appearance: Normal appearance. She is overweight. She is not ill-appearing, toxic-appearing or diaphoretic.  HENT:     Head: Normocephalic and atraumatic.  Cardiovascular:     Rate and Rhythm: Normal rate and regular rhythm.     Pulses: Normal pulses.     Heart sounds: Normal heart sounds. No murmur heard.   No friction rub. No gallop.  Pulmonary:     Effort: Pulmonary effort is normal. No respiratory distress.     Breath sounds: Normal breath sounds. No stridor. No wheezing, rhonchi or rales.  Chest:     Chest wall: No tenderness.  Abdominal:     General: Bowel sounds are normal.     Palpations: Abdomen is soft.  Musculoskeletal:        General: No swelling, tenderness, deformity or signs of injury. Normal range of motion.     Right  lower leg: No edema.     Left lower leg: No edema.  Skin:    General: Skin is warm and dry.     Capillary Refill: Capillary refill takes less than 2 seconds.     Coloration: Skin is not jaundiced or pale.     Findings: No bruising, erythema, lesion or rash.  Neurological:     General: No focal deficit present.     Mental Status: She is alert and oriented to person, place, and time. Mental status is at baseline.     Cranial Nerves: No cranial nerve deficit.     Sensory: No sensory deficit.     Motor: No weakness.     Coordination: Coordination normal.   Psychiatric:        Mood and Affect: Mood normal.        Behavior: Behavior normal.        Thought Content: Thought content normal.        Judgment: Judgment normal.     No results found for any visits on 09/03/21.  Assessment & Plan     Problem List Items Addressed This Visit       Cardiovascular and Mediastinum   Migraine    Pulled for medication refill; requires generic medication       Relevant Medications   SUMAtriptan (IMITREX) 50 MG tablet     Other   Attention deficit hyperactivity disorder (ADHD), combined type - Primary    Worsening since starting school online for phlebotomy       Relevant Medications   amphetamine-dextroamphetamine (ADDERALL) 20 MG tablet     Return in about 3 months (around 12/04/2021) for chonic disease management.      Vonna Kotyk, FNP, have reviewed all documentation for this visit. The documentation on 09/03/21 for the exam, diagnosis, procedures, and orders are all accurate and complete.    Gwyneth Sprout, [place] [phone] (phone) [phone] (fax)  [place]

## 2021-09-04 ENCOUNTER — Ambulatory Visit: Payer: Medicaid Other | Admitting: Dermatology

## 2021-09-10 ENCOUNTER — Other Ambulatory Visit: Payer: Self-pay | Admitting: Family Medicine

## 2021-09-10 ENCOUNTER — Telehealth: Payer: Self-pay

## 2021-09-10 DIAGNOSIS — Z1231 Encounter for screening mammogram for malignant neoplasm of breast: Secondary | ICD-10-CM

## 2021-09-10 NOTE — Telephone Encounter (Signed)
Advised pt of bx results/sh ?

## 2021-09-10 NOTE — Telephone Encounter (Signed)
-----   Message from Ralene Bathe, MD sent at 08/28/2021  5:28 PM EDT ----- Diagnosis 1. Skin , left base of neck superior MELANOCYTIC NEVUS, INTRADERMAL TYPE, IRRITATED 2. Skin , left base of neck inferior MELANOCYTIC NEVUS, INTRADERMAL TYPE, IRRITATED  1&2 - both benign irritated moles No further treatment needed

## 2021-09-24 ENCOUNTER — Other Ambulatory Visit: Payer: Self-pay

## 2021-09-24 ENCOUNTER — Ambulatory Visit (INDEPENDENT_AMBULATORY_CARE_PROVIDER_SITE_OTHER): Payer: 59 | Admitting: Dermatology

## 2021-09-24 ENCOUNTER — Encounter: Payer: Self-pay | Admitting: Dermatology

## 2021-09-24 DIAGNOSIS — L814 Other melanin hyperpigmentation: Secondary | ICD-10-CM

## 2021-09-24 DIAGNOSIS — D280 Benign neoplasm of vulva: Secondary | ICD-10-CM

## 2021-09-24 DIAGNOSIS — D492 Neoplasm of unspecified behavior of bone, soft tissue, and skin: Secondary | ICD-10-CM

## 2021-09-24 DIAGNOSIS — L820 Inflamed seborrheic keratosis: Secondary | ICD-10-CM

## 2021-09-24 NOTE — Progress Notes (Signed)
   Follow-Up Visit   Subjective  Heather Harrell is a 40 y.o. female who presents for the following: lesions (Patient c/o spots she would like removed. Two at mid chest, one at pubic area and one at medial thigh. Rubbed and irritated. ).  They are irritated by rubbing/catching on clothing.  Husband with patient.   The following portions of the chart were reviewed this encounter and updated as appropriate:      Review of Systems: No other skin or systemic complaints except as noted in HPI or Assessment and Plan.   Objective  Well appearing patient in no apparent distress; mood and affect are within normal limits.  A focused examination was performed including face, torso, legs. Relevant physical exam findings are noted in the Assessment and Plan.  right medial inframammary x2, right medial thigh x1 (3) Erythematous keratotic or waxy stuck-on papule   left vulva 0.6 cm flesh papule  Assessment & Plan  Inflamed seborrheic keratosis right medial inframammary x2, right medial thigh x1  Destruction of lesion - right medial inframammary x2, right medial thigh x1  Destruction method: cryotherapy   Informed consent: discussed and consent obtained   Lesion destroyed using liquid nitrogen: Yes   Region frozen until ice ball extended beyond lesion: Yes   Outcome: patient tolerated procedure well with no complications   Post-procedure details: wound care instructions given   Additional details:  Prior to procedure, discussed risks of blister formation, small wound, skin dyspigmentation, or rare scar following cryotherapy. Recommend Vaseline ointment to treated areas while healing.   Neoplasm of skin left vulva  Epidermal / dermal shaving  Lesion diameter (cm):  0.6 Informed consent: discussed and consent obtained   Patient was prepped and draped in usual sterile fashion: Area prepped with alcohol. Anesthesia: the lesion was anesthetized in a standard fashion   Anesthetic:  1%  lidocaine w/ epinephrine 1-100,000 buffered w/ 8.4% NaHCO3 Instrument used: flexible razor blade   Hemostasis achieved with: pressure, aluminum chloride and electrodesiccation   Outcome: patient tolerated procedure well   Post-procedure details: wound care instructions given   Post-procedure details comment:  Ointment and small bandage applied  Specimen 1 - Surgical pathology Differential Diagnosis: irritated nevus   Check Margins: No  Lentigines - Scattered tan macules - Due to sun exposure - Benign-appering, observe - Recommend daily broad spectrum sunscreen SPF 30+ to sun-exposed areas, reapply every 2 hours as needed. - Call for any changes   Return if symptoms worsen or fail to improve.  I, Emelia Salisbury, CMA, am acting as scribe for Brendolyn Patty, MD.  Documentation: I have reviewed the above documentation for accuracy and completeness, and I agree with the above.  Brendolyn Patty MD

## 2021-09-24 NOTE — Patient Instructions (Signed)
Prior to procedure, discussed risks of blister formation, small wound, skin dyspigmentation, or rare scar following cryotherapy. Recommend Vaseline ointment to treated areas while healing.   Cryotherapy Aftercare  Wash gently with soap and water everyday.   Apply Vaseline and Band-Aid daily until healed.   Wound Care Instructions  Cleanse wound gently with soap and water once a day then pat dry with clean gauze. Apply a thing coat of Petrolatum (petroleum jelly, "Vaseline") over the wound (unless you have an allergy to this). We recommend that you use a new, sterile tube of Vaseline. Do not pick or remove scabs. Do not remove the yellow or white "healing tissue" from the base of the wound.  Cover the wound with fresh, clean, nonstick gauze and secure with paper tape. You may use Band-Aids in place of gauze and tape if the would is small enough, but would recommend trimming much of the tape off as there is often too much. Sometimes Band-Aids can irritate the skin.  You should call the office for your biopsy report after 1 week if you have not already been contacted.  If you experience any problems, such as abnormal amounts of bleeding, swelling, significant bruising, significant pain, or evidence of infection, please call the office immediately.  FOR ADULT SURGERY PATIENTS: If you need something for pain relief you may take 1 extra strength Tylenol (acetaminophen) AND 2 Ibuprofen (200mg  each) together every 4 hours as needed for pain. (do not take these if you are allergic to them or if you have a reason you should not take them.) Typically, you may only need pain medication for 1 to 3 days.     If you have any questions or concerns for your doctor, please call our main line at [phone] and press option 4 to reach your doctor's medical assistant. If no one answers, please leave a voicemail as directed and we will return your call as soon as possible. Messages left after 4 pm will be answered  the following business day.   You may also send [place] a message via [place]. We typically respond to MyChart messages within 1-2 business days.  For prescription refills, please ask your pharmacy to contact our office. Our fax number is [phone].  If you have an urgent issue when the clinic is closed that cannot wait until the next business day, you can page your doctor at the number below.    Please note that while we do our best to be available for urgent issues outside of office hours, we are not available 24/7.   If you have an urgent issue and are unable to reach [place], you may choose to seek medical care at your doctor's office, retail clinic, urgent care center, or emergency room.  If you have a medical emergency, please immediately call 911 or go to the emergency department.  Pager Numbers  - Dr. Nehemiah Massed: [phone]  - Dr. Laurence Ferrari: [phone]  - Dr. Nicole Kindred: [phone]  In the event of inclement weather, please call our main line at [phone] for an update on the status of any delays or closures.  Dermatology Medication Tips: Please keep the boxes that topical medications come in in order to help keep track of the instructions about where and how to use these. Pharmacies typically print the medication instructions only on the boxes and not directly on the medication tubes.   If your medication is too expensive, please contact our office at [phone] option 4 or send [place] a message through  MyChart.   We are unable to tell what your co-pay for medications will be in advance as this is different depending on your insurance coverage. However, we may be able to find a substitute medication at lower cost or fill out paperwork to get insurance to cover a needed medication.   If a prior authorization is required to get your medication covered by your insurance company, please allow [place] 1-2 business days to complete this process.  Drug prices often vary depending on where the  prescription is filled and some pharmacies may offer cheaper prices.  The website www.goodrx.com contains coupons for medications through different pharmacies. The prices here do not account for what the cost may be with help from insurance (it may be cheaper with your insurance), but the website can give you the price if you did not use any insurance.  - You can print the associated coupon and take it with your prescription to the pharmacy.  - You may also stop by our office during regular business hours and pick up a GoodRx coupon card.  - If you need your prescription sent electronically to a different pharmacy, notify our office through [facility] or by phone at [phone] option 4.

## 2021-09-29 ENCOUNTER — Telehealth: Payer: Self-pay

## 2021-09-29 NOTE — Telephone Encounter (Signed)
Advised patient biopsy was benign, may recur. No further treatment.

## 2021-09-29 NOTE — Telephone Encounter (Signed)
-----   Message from Brendolyn Patty, MD sent at 09/29/2021  9:17 AM EDT ----- Skin , left vulva MELANOCYTIC NEVUS, INTRADERMAL TYPE, BASE INVOLVED  Benign mole, may recur - please call patient

## 2021-10-13 ENCOUNTER — Ambulatory Visit
Admission: RE | Admit: 2021-10-13 | Discharge: 2021-10-13 | Disposition: A | Discharge status: Home / Self Care | Payer: 59 | Source: Ambulatory Visit | Attending: Family Medicine | Admitting: Family Medicine

## 2021-10-13 ENCOUNTER — Other Ambulatory Visit: Payer: Self-pay

## 2021-10-13 DIAGNOSIS — Z1231 Encounter for screening mammogram for malignant neoplasm of breast: Secondary | ICD-10-CM | POA: Diagnosis not present

## 2021-10-14 ENCOUNTER — Other Ambulatory Visit: Payer: Self-pay | Admitting: Family Medicine

## 2021-10-14 DIAGNOSIS — R928 Other abnormal and inconclusive findings on diagnostic imaging of breast: Secondary | ICD-10-CM

## 2021-10-14 DIAGNOSIS — N6310 Unspecified lump in the right breast, unspecified quadrant: Secondary | ICD-10-CM

## 2021-10-14 DIAGNOSIS — N6489 Other specified disorders of breast: Secondary | ICD-10-CM

## 2021-10-16 ENCOUNTER — Encounter: Payer: Self-pay | Admitting: Family Medicine

## 2021-10-16 ENCOUNTER — Other Ambulatory Visit: Payer: Self-pay

## 2021-10-16 ENCOUNTER — Ambulatory Visit (INDEPENDENT_AMBULATORY_CARE_PROVIDER_SITE_OTHER): Payer: 59 | Admitting: Family Medicine

## 2021-10-16 VITALS — BP 156/96 | HR 104 | Temp 97.9°F | Resp 16 | Wt 145.0 lb

## 2021-10-16 DIAGNOSIS — F902 Attention-deficit hyperactivity disorder, combined type: Secondary | ICD-10-CM | POA: Diagnosis not present

## 2021-10-16 DIAGNOSIS — E663 Overweight: Secondary | ICD-10-CM | POA: Diagnosis not present

## 2021-10-16 DIAGNOSIS — G43809 Other migraine, not intractable, without status migrainosus: Secondary | ICD-10-CM

## 2021-10-16 DIAGNOSIS — E162 Hypoglycemia, unspecified: Secondary | ICD-10-CM

## 2021-10-16 DIAGNOSIS — I10 Essential (primary) hypertension: Secondary | ICD-10-CM | POA: Diagnosis not present

## 2021-10-16 DIAGNOSIS — L659 Nonscarring hair loss, unspecified: Secondary | ICD-10-CM | POA: Diagnosis not present

## 2021-10-16 MED ORDER — AMPHETAMINE-DEXTROAMPHETAMINE 10 MG PO TABS: 10.0000 mg | ORAL_TABLET | Freq: Two times a day (BID) | ORAL | 0 refills | Status: DC

## 2021-10-16 MED ORDER — SUMATRIPTAN SUCCINATE 50 MG PO TABS: 50.0000 mg | ORAL_TABLET | ORAL | 11 refills | Status: DC | PRN

## 2021-10-16 MED ORDER — HYDROCHLOROTHIAZIDE 12.5 MG PO TABS: 12.5000 mg | ORAL_TABLET | Freq: Every day | ORAL | 3 refills | Status: DC

## 2021-10-16 NOTE — Assessment & Plan Note (Signed)
Previously lifestyle controlled but elevated on higher Adderall dose Now on lower Adderall dose, but BP still high Asymptomatic today, but lots of headaches last week Will start HCTZ 12.5mg  daily Check CMP F/u in 1 month

## 2021-10-16 NOTE — Assessment & Plan Note (Signed)
Poorly controlled Avoid higher doses of Adderall with elevated BP Consider Vyvanse vs Concerta at next visit

## 2021-10-16 NOTE — Assessment & Plan Note (Signed)
Discussed importance of healthy weight management Discussed diet and exercise  

## 2021-10-16 NOTE — Progress Notes (Signed)
Established patient visit   Patient: Heather Harrell   DOB: 21-Aug-1981   40 y.o. Female  MRN: [id number] Visit Date: 10/16/2021  Today's healthcare provider: Lavon Paganini, MD   Chief Complaint  Patient presents with   ADHD   Hypertension   Subjective    HPI  Hypertension, follow-up  BP Readings from Last 3 Encounters:  10/16/21 (!) 156/96  09/03/21 131/86  07/11/21 126/88   Wt Readings from Last 3 Encounters:  10/16/21 145 lb (65.8 kg)  09/03/21 146 lb 1.6 oz (66.3 kg)  07/11/21 142 lb 11.2 oz (64.7 kg)     She was last seen for hypertension 3 months ago.  BP at that visit was 126/88. Management since that visit includes no changes.  She reports excellent compliance with treatment. She is not having side effects.  She is following a Regular diet. She is not exercising. She does smoke.  Use of agents associated with hypertension: none.   Outside blood pressures are elevated. 150-160s/90s  Symptoms: No chest pain No chest pressure  No palpitations No syncope  No dyspnea No orthopnea  No paroxysmal nocturnal dyspnea No lower extremity edema   Pertinent labs: Lab Results  Component Value Date   CHOL 172 04/10/2021   HDL 61 04/10/2021   LDLCALC 100 (H) 04/10/2021   TRIG 56 04/10/2021   CHOLHDL 2.8 04/10/2021   Lab Results  Component Value Date   NA 138 04/10/2021   K 4.3 04/10/2021   CREATININE 0.90 04/10/2021   EGFR 83 04/10/2021   GLUCOSE 80 04/10/2021     The 10-year ASCVD risk score (Arnett DK, et al., 2019) is: 3%   ---------------------------------------------------------------------------------------------------  Follow up for ADHD  The patient was last seen for this 1 months ago. Changes made at last visit include increase Adderall to 40m BID.  She reports fair compliance with treatment. Patient reports taking Adderall 276mBID for two days only. She reports taking 1078mID. She feels that condition is Worse. Poorly  controlled. Does have more stress and anxiety. Tried to stop it all together and she felt like she couldn't even get out of bed. She is having side effects. Elevated BP  ----------------------------------------------------------------------------------------- Patient would like to have labs checked due to hair loss, and being very moody.    Medications: Outpatient Medications Prior to Visit  Medication Sig   Accu-Chek Softclix Lancets lancets Use as directed   Blood Glucose Monitoring Suppl (ACCU-CHEK NANO SMARTVIEW) w/Device KIT Use as directed   glucose blood (ACCU-CHEK SMARTVIEW) test strip Use as instructed   ibuprofen (ADVIL,MOTRIN) 800 MG tablet Take 1 tablet (800 mg total) by mouth every 6 (six) hours as needed.   ondansetron (ZOFRAN-ODT) 8 MG disintegrating tablet Take 1 tablet (8 mg total) by mouth every 8 (eight) hours as needed.   [DISCONTINUED] amphetamine-dextroamphetamine (ADDERALL) 20 MG tablet Take 1 tablet (20 mg total) by mouth 2 (two) times daily. (Patient taking differently: Take 10 mg by mouth 2 (two) times daily.)   [DISCONTINUED] SUMAtriptan (IMITREX) 50 MG tablet Take 1 tablet (50 mg total) by mouth every 2 (two) hours as needed for migraine. May repeat in 2 hours if headache persists or recurs.   progesterone (PROMETRIUM) 200 MG capsule Take 1 capsule (200 mg total) by mouth daily for 12 days. (Patient not taking: Reported on 10/16/2021)   No facility-administered medications prior to visit.    Review of Systems  Constitutional:  Positive for activity change.  Respiratory:  Negative for chest tightness and shortness of breath.   Cardiovascular:  Negative for chest pain.  Neurological:  Positive for headaches.  Psychiatric/Behavioral:  Positive for agitation and dysphoric mood. The patient is nervous/anxious.        Objective    BP (!) 156/96 (BP Location: Left Arm, Patient Position: Sitting, Cuff Size: Large)   Pulse (!) 104   Temp 97.9 F (36.6 C)  (Temporal)   Resp 16   Wt 145 lb (65.8 kg)   LMP 10/07/2021   SpO2 98%   BMI 25.69 kg/m  BP Readings from Last 3 Encounters:  10/16/21 (!) 156/96  09/03/21 131/86  07/11/21 126/88   Wt Readings from Last 3 Encounters:  10/16/21 145 lb (65.8 kg)  09/03/21 146 lb 1.6 oz (66.3 kg)  07/11/21 142 lb 11.2 oz (64.7 kg)      Physical Exam Vitals reviewed.  Constitutional:      General: She is not in acute distress.    Appearance: Normal appearance. She is well-developed. She is not diaphoretic.  HENT:     Head: Normocephalic and atraumatic.  Eyes:     General: No scleral icterus.    Conjunctiva/sclera: Conjunctivae normal.  Neck:     Thyroid: No thyromegaly.  Cardiovascular:     Rate and Rhythm: Normal rate and regular rhythm.     Pulses: Normal pulses.     Heart sounds: Normal heart sounds. No murmur heard. Pulmonary:     Effort: Pulmonary effort is normal. No respiratory distress.     Breath sounds: Normal breath sounds. No wheezing, rhonchi or rales.  Musculoskeletal:     Cervical back: Neck supple.     Right lower leg: No edema.     Left lower leg: No edema.  Lymphadenopathy:     Cervical: No cervical adenopathy.  Skin:    General: Skin is warm and dry.     Findings: No rash.  Neurological:     Mental Status: She is alert and oriented to person, place, and time. Mental status is at baseline.  Psychiatric:        Mood and Affect: Mood normal.        Behavior: Behavior normal.      No results found for any visits on 10/16/21.  Assessment & Plan     Problem List Items Addressed This Visit       Cardiovascular and Mediastinum   Migraine   Relevant Medications   SUMAtriptan (IMITREX) 50 MG tablet   hydrochlorothiazide (HYDRODIURIL) 12.5 MG tablet   Primary hypertension - Primary    Previously lifestyle controlled but elevated on higher Adderall dose Now on lower Adderall dose, but BP still high Asymptomatic today, but lots of headaches last week Will  start HCTZ 12.39m daily Check CMP F/u in 1 month      Relevant Medications   hydrochlorothiazide (HYDRODIURIL) 12.5 MG tablet   Other Relevant Orders   Comprehensive metabolic panel     Endocrine   Hypoglycemia    Check A1c Previously with difficulty fasting      Relevant Orders   Hemoglobin A1c     Other   Overweight    Discussed importance of healthy weight management Discussed diet and exercise       Relevant Orders   TSH   Hemoglobin A1c   Comprehensive metabolic panel   CBC   Attention deficit hyperactivity disorder (ADHD), combined type    Poorly controlled Avoid higher doses of Adderall with elevated BP  Consider Vyvanse vs Concerta at next visit      Hair loss    Ongoing since having COVID Diffuse and non-patchy Check labs for possible underlying causes      Relevant Orders   TSH   Comprehensive metabolic panel   CBC     Return in about 4 weeks (around 11/13/2021) for CPE, as scheduled, BP f/u.      I, Lavon Paganini, MD, have reviewed all documentation for this visit. The documentation on 10/16/21 for the exam, diagnosis, procedures, and orders are all accurate and complete.   Sadhana Frater, Dionne Bucy, MD, MPH [facility]

## 2021-10-16 NOTE — Assessment & Plan Note (Signed)
Ongoing since having COVID Diffuse and non-patchy Check labs for possible underlying causes

## 2021-10-16 NOTE — Assessment & Plan Note (Signed)
Check A1c Previously with difficulty fasting

## 2021-10-17 LAB — COMPREHENSIVE METABOLIC PANEL
ALT: 8 IU/L (ref 0–32)
AST: 13 IU/L (ref 0–40)
Albumin/Globulin Ratio: 2.1 (ref 1.2–2.2)
Albumin: 4.5 g/dL (ref 3.8–4.8)
Alkaline Phosphatase: 78 IU/L (ref 44–121)
BUN/Creatinine Ratio: 18 (ref 9–23)
BUN: 13 mg/dL (ref 6–24)
Bilirubin Total: 0.3 mg/dL (ref 0.0–1.2)
CO2: 23 mmol/L (ref 20–29)
Calcium: 9.4 mg/dL (ref 8.7–10.2)
Chloride: 103 mmol/L (ref 96–106)
Creatinine, Ser: 0.71 mg/dL (ref 0.57–1.00)
Globulin, Total: 2.1 g/dL (ref 1.5–4.5)
Glucose: 80 mg/dL (ref 70–99)
Potassium: 4.3 mmol/L (ref 3.5–5.2)
Sodium: 139 mmol/L (ref 134–144)
Total Protein: 6.6 g/dL (ref 6.0–8.5)
eGFR: 109 mL/min/{1.73_m2} (ref >59)

## 2021-10-17 LAB — CBC
Hematocrit: 38.3 % (ref 34.0–46.6)
Hemoglobin: 13.2 g/dL (ref 11.1–15.9)
MCH: 30.6 pg (ref 26.6–33.0)
MCHC: 34.5 g/dL (ref 31.5–35.7)
MCV: 89 fL (ref 79–97)
Platelets: 429 10*3/uL (ref 150–450)
RBC: 4.31 x10E6/uL (ref 3.77–5.28)
RDW: 12.1 % (ref 11.7–15.4)
WBC: 9.5 10*3/uL (ref 3.4–10.8)

## 2021-10-17 LAB — HEMOGLOBIN A1C
Est. average glucose Bld gHb Est-mCnc: 94 mg/dL
Hgb A1c MFr Bld: 4.9 % (ref 4.8–5.6)

## 2021-10-17 LAB — TSH: TSH: 0.709 u[IU]/mL (ref 0.450–4.500)

## 2021-10-27 ENCOUNTER — Ambulatory Visit: Payer: 59 | Admitting: Family Medicine

## 2021-10-29 ENCOUNTER — Ambulatory Visit
Admission: RE | Admit: 2021-10-29 | Discharge: 2021-10-29 | Disposition: A | Discharge status: Home / Self Care | Payer: 59 | Source: Ambulatory Visit | Attending: Family Medicine | Admitting: Family Medicine

## 2021-10-29 ENCOUNTER — Other Ambulatory Visit: Payer: Self-pay

## 2021-10-29 DIAGNOSIS — R928 Other abnormal and inconclusive findings on diagnostic imaging of breast: Secondary | ICD-10-CM

## 2021-10-29 DIAGNOSIS — N6310 Unspecified lump in the right breast, unspecified quadrant: Secondary | ICD-10-CM

## 2021-10-29 DIAGNOSIS — N6489 Other specified disorders of breast: Secondary | ICD-10-CM | POA: Insufficient documentation

## 2021-11-10 ENCOUNTER — Ambulatory Visit (INDEPENDENT_AMBULATORY_CARE_PROVIDER_SITE_OTHER): Payer: 59 | Admitting: Family Medicine

## 2021-11-10 ENCOUNTER — Other Ambulatory Visit: Payer: Self-pay

## 2021-11-10 ENCOUNTER — Encounter: Payer: Self-pay | Admitting: Family Medicine

## 2021-11-10 VITALS — BP 113/73 | HR 88 | Temp 97.8°F | Resp 16 | Wt 141.1 lb

## 2021-11-10 DIAGNOSIS — I10 Essential (primary) hypertension: Secondary | ICD-10-CM | POA: Diagnosis not present

## 2021-11-10 DIAGNOSIS — F902 Attention-deficit hyperactivity disorder, combined type: Secondary | ICD-10-CM | POA: Diagnosis not present

## 2021-11-10 MED ORDER — AMPHETAMINE-DEXTROAMPHET ER 15 MG PO CP24: 15.0000 mg | ORAL_CAPSULE | ORAL | 0 refills | Status: DC

## 2021-11-10 NOTE — Assessment & Plan Note (Signed)
Poorly controlled Change to Adderall XR 15mg  F/u in 3 months

## 2021-11-10 NOTE — Assessment & Plan Note (Signed)
Well controlled Continue HCTZ 12.5mg  daily Reviewed previous metabolic panel

## 2021-11-10 NOTE — Progress Notes (Signed)
Established patient visit   Patient: Heather Harrell   DOB: [birth year]   40 y.o. Female  MRN: [id number] Visit Date: 11/10/2021  Today's healthcare provider: Lavon Paganini, MD   Chief Complaint  Patient presents with   Hypertension   ADHD   Subjective     ADHD - having difficulty with inattentiveness and forgetfulness - back down to 31m dose due to hypertension - has not tried any other medications  Hypertension - has had fewer headaches - tolerating medication well  Medications: Outpatient Medications Prior to Visit  Medication Sig   Accu-Chek Softclix Lancets lancets Use as directed   Blood Glucose Monitoring Suppl (ACCU-CHEK NANO SMARTVIEW) w/Device KIT Use as directed   glucose blood (ACCU-CHEK SMARTVIEW) test strip Use as instructed   hydrochlorothiazide (HYDRODIURIL) 12.5 MG tablet Take 1 tablet (12.5 mg total) by mouth daily.   ibuprofen (ADVIL,MOTRIN) 800 MG tablet Take 1 tablet (800 mg total) by mouth every 6 (six) hours as needed.   ondansetron (ZOFRAN-ODT) 8 MG disintegrating tablet Take 1 tablet (8 mg total) by mouth every 8 (eight) hours as needed.   SUMAtriptan (IMITREX) 50 MG tablet Take 1 tablet (50 mg total) by mouth every 2 (two) hours as needed for migraine. May repeat in 2 hours if headache persists or recurs.   [DISCONTINUED] amphetamine-dextroamphetamine (ADDERALL) 10 MG tablet Take 1 tablet (10 mg total) by mouth 2 (two) times daily.   [DISCONTINUED] progesterone (PROMETRIUM) 200 MG capsule Take 1 capsule (200 mg total) by mouth daily for 12 days.   No facility-administered medications prior to visit.    Review of Systems  Respiratory:  Negative for chest tightness and shortness of breath.   Cardiovascular:  Negative for chest pain and leg swelling.  Gastrointestinal:  Negative for abdominal pain.      Objective    BP 113/73 (BP Location: Left Arm, Patient Position: Sitting, Cuff Size: Large)   Pulse 88   Temp 97.8 F (36.6 C)  (Temporal)   Resp 16   Wt 141 lb 1.6 oz (64 kg)   SpO2 100%   BMI 24.99 kg/m  {Show previous vital signs (optional):23777}  Physical Exam Vitals reviewed.  Constitutional:      General: She is not in acute distress.    Appearance: Normal appearance. She is not ill-appearing or toxic-appearing.  HENT:     Head: Normocephalic and atraumatic.     Right Ear: External ear normal.     Left Ear: External ear normal.     Nose: Nose normal.     Mouth/Throat:     Mouth: Mucous membranes are moist.     Pharynx: Oropharynx is clear. No oropharyngeal exudate or posterior oropharyngeal erythema.  Eyes:     General: No scleral icterus.    Extraocular Movements: Extraocular movements intact.     Conjunctiva/sclera: Conjunctivae normal.     Pupils: Pupils are equal, round, and reactive to light.  Cardiovascular:     Rate and Rhythm: Normal rate and regular rhythm.     Pulses: Normal pulses.     Heart sounds: Normal heart sounds. No murmur heard.   No friction rub. No gallop.  Pulmonary:     Effort: Pulmonary effort is normal. No respiratory distress.     Breath sounds: Normal breath sounds. No wheezing or rhonchi.  Chest:     Chest wall: No tenderness.  Abdominal:     General: Abdomen is flat. There is no distension.  Palpations: Abdomen is soft.     Tenderness: There is no abdominal tenderness.  Musculoskeletal:        General: Normal range of motion.     Cervical back: Normal range of motion and neck supple.     Right lower leg: No edema.     Left lower leg: No edema.  Skin:    General: Skin is warm and dry.     Capillary Refill: Capillary refill takes less than 2 seconds.     Findings: No lesion or rash.  Neurological:     General: No focal deficit present.     Mental Status: She is alert and oriented to person, place, and time. Mental status is at baseline.  Psychiatric:        Mood and Affect: Mood normal.     Results for orders placed or performed in visit on 11/10/21   HM PAP SMEAR  Result Value Ref Range   HM Pap smear negative     Assessment & Plan     Problem List Items Addressed This Visit       Cardiovascular and Mediastinum   Primary hypertension    Well controlled Continue HCTZ 12.56m daily Reviewed previous metabolic panel      Relevant Orders   Basic Metabolic Panel (BMET)     Other   Attention deficit hyperactivity disorder (ADHD), combined type - Primary    Poorly controlled Change to Adderall XR 19mF/u in 3 months        Return in about 3 months (around 02/08/2022) for ADD f/u.      ElNelva NayMedical Student 11/10/2021, 12:17 PM   Patient seen along with MS3 student ElNelva NayI personally evaluated this patient along with the student, and verified all aspects of the history, physical exam, and medical decision making as documented by the student. I agree with the student's documentation and have made all necessary edits.  Keil Pickering, AnDionne BucyMD, MPH [facility]

## 2021-11-11 LAB — BASIC METABOLIC PANEL
BUN/Creatinine Ratio: 15 (ref 9–23)
BUN: 10 mg/dL (ref 6–24)
CO2: 22 mmol/L (ref 20–29)
Calcium: 9.4 mg/dL (ref 8.7–10.2)
Chloride: 99 mmol/L (ref 96–106)
Creatinine, Ser: 0.66 mg/dL (ref 0.57–1.00)
Glucose: 86 mg/dL (ref 70–99)
Potassium: 4 mmol/L (ref 3.5–5.2)
Sodium: 140 mmol/L (ref 134–144)
eGFR: 113 mL/min/{1.73_m2} (ref >59)

## 2021-12-03 ENCOUNTER — Encounter: Payer: Self-pay | Admitting: Family Medicine

## 2021-12-04 ENCOUNTER — Encounter: Payer: Self-pay | Admitting: Family Medicine

## 2021-12-04 MED ORDER — AMPHETAMINE-DEXTROAMPHETAMINE 10 MG PO TABS: 10.0000 mg | ORAL_TABLET | Freq: Two times a day (BID) | ORAL | 0 refills | Status: DC

## 2021-12-04 NOTE — Telephone Encounter (Signed)
Do you know how to update her name in the chart? You can send refill of BP meds after its changed

## 2021-12-08 ENCOUNTER — Telehealth: Payer: Self-pay

## 2021-12-08 MED ORDER — HYDROCHLOROTHIAZIDE 12.5 MG PO TABS: 12.5000 mg | ORAL_TABLET | Freq: Every day | ORAL | 3 refills | Status: DC

## 2021-12-08 NOTE — Telephone Encounter (Signed)
[facility] faxed refill request for the following medications:  hydrochlorothiazide (HYDRODIURIL) 12.5 MG tablet   Please advise.  

## 2021-12-08 NOTE — Telephone Encounter (Signed)
Copied from [place] [phone]. Topic: General - Other >> Dec 08, 2021  3:20 PM Tessa Lerner A wrote: Reason for CRM: The patient has called requesting to speak with a member of clinical staff when possible  The patient has concerns with the shortage of adderall and continuing to take their medications as directed   Please contact further when possible

## 2021-12-08 NOTE — Telephone Encounter (Signed)
Medications refilled. Patient's name change.

## 2021-12-08 NOTE — Telephone Encounter (Signed)
refilled 

## 2021-12-09 ENCOUNTER — Encounter: Payer: Self-pay | Admitting: Family Medicine

## 2021-12-09 MED ORDER — AMPHETAMINE-DEXTROAMPHETAMINE 10 MG PO TABS: 10.0000 mg | ORAL_TABLET | Freq: Two times a day (BID) | ORAL | 0 refills | Status: DC

## 2021-12-09 NOTE — Telephone Encounter (Signed)
Brand name prescription was approved by patients insurance, however out of pocket is over $500. [facility] [place] and [facility] do [facility] in [facility]. Patient advised [facility] [place].

## 2021-12-09 NOTE — Telephone Encounter (Signed)
Patient reports [facility] advised her that they do not have any Adderral. Spoke to [place] at [facility] and she reports that they do not have any Adderral in any dose no 5, 10, 20 or 30s. Pharmacist did report that they have brand name medication. They will contact patient to get correct insurance information to try to process.

## 2021-12-09 NOTE — Telephone Encounter (Signed)
Most folks are still able to get it from pharmacies in the area. Has she had trouble getting it (would recommend calling other local pharmacies) or is she just trying to plan ahead?

## 2022-01-07 ENCOUNTER — Other Ambulatory Visit: Payer: Self-pay | Admitting: Family Medicine

## 2022-01-07 DIAGNOSIS — G43809 Other migraine, not intractable, without status migrainosus: Secondary | ICD-10-CM

## 2022-01-07 MED ORDER — ONDANSETRON 8 MG PO TBDP: 8.0000 mg | ORAL_TABLET | Freq: Three times a day (TID) | ORAL | 0 refills | Status: AC | PRN

## 2022-01-13 ENCOUNTER — Other Ambulatory Visit: Payer: Self-pay | Admitting: Physician Assistant

## 2022-01-15 MED ORDER — AMPHETAMINE-DEXTROAMPHETAMINE 10 MG PO TABS: 10.0000 mg | ORAL_TABLET | Freq: Two times a day (BID) | ORAL | 0 refills | Status: DC

## 2022-01-26 ENCOUNTER — Encounter: Payer: Self-pay | Admitting: Family Medicine

## 2022-01-26 ENCOUNTER — Other Ambulatory Visit: Payer: Self-pay

## 2022-01-26 ENCOUNTER — Ambulatory Visit (INDEPENDENT_AMBULATORY_CARE_PROVIDER_SITE_OTHER): Payer: Managed Care, Other (non HMO) | Admitting: Family Medicine

## 2022-01-26 VITALS — BP 121/79 | HR 92 | Temp 98.2°F | Resp 16 | Wt 146.3 lb

## 2022-01-26 DIAGNOSIS — F902 Attention-deficit hyperactivity disorder, combined type: Secondary | ICD-10-CM

## 2022-01-26 DIAGNOSIS — F17200 Nicotine dependence, unspecified, uncomplicated: Secondary | ICD-10-CM | POA: Diagnosis not present

## 2022-01-26 DIAGNOSIS — I10 Essential (primary) hypertension: Secondary | ICD-10-CM

## 2022-01-26 MED ORDER — LISDEXAMFETAMINE DIMESYLATE 20 MG PO CAPS: 20.0000 mg | ORAL_CAPSULE | Freq: Every day | ORAL | 0 refills | Status: DC

## 2022-01-26 NOTE — Assessment & Plan Note (Signed)
Well controlled. Continue current medications  

## 2022-01-26 NOTE — Assessment & Plan Note (Signed)
Poorly controlled Did not tolerate adderall or adderall XR due to jitteriness Trial of Vyvanse 20 mg daily F/u in 2-3 months

## 2022-01-26 NOTE — Assessment & Plan Note (Signed)
Remains precontemplative Recommend cessation

## 2022-01-26 NOTE — Progress Notes (Signed)
I,Sulibeya S Dimas,acting as a Education administrator for Lavon Paganini, MD.,have documented all relevant documentation on the behalf of Lavon Paganini, MD,as directed by  Lavon Paganini, MD while in the presence of Lavon Paganini, MD.   Established patient visit   Patient: Heather Harrell   DOB: 1981-11-26   41 y.o. Female  MRN: [id number] Visit Date: 01/26/2022  Today's healthcare provider: Lavon Paganini, MD   Chief Complaint  Patient presents with   ADHD   Subjective    HPI  Follow up for ADHD  The patient was last seen for this 2 months ago. Changes made at last visit include change to ADDERALL XR 88m. Patient did not tolerate. Medication was changed to Adderall 165mBID. Patient does want to change medication to Vyvanse.   She reports good compliance with treatment. She feels that condition is Worse. She is not having side effects.   -----------------------------------------------------------------------------------------   Medications: Outpatient Medications Prior to Visit  Medication Sig   Accu-Chek Softclix Lancets lancets Use as directed   Blood Glucose Monitoring Suppl (ACCU-CHEK NANO SMARTVIEW) w/Device KIT Use as directed   glucose blood (ACCU-CHEK SMARTVIEW) test strip Use as instructed   hydrochlorothiazide (HYDRODIURIL) 12.5 MG tablet Take 1 tablet (12.5 mg total) by mouth daily.   ibuprofen (ADVIL,MOTRIN) 800 MG tablet Take 1 tablet (800 mg total) by mouth every 6 (six) hours as needed.   ondansetron (ZOFRAN-ODT) 8 MG disintegrating tablet Take 1 tablet (8 mg total) by mouth every 8 (eight) hours as needed.   SUMAtriptan (IMITREX) 50 MG tablet Take 1 tablet (50 mg total) by mouth every 2 (two) hours as needed for migraine. May repeat in 2 hours if headache persists or recurs.   [DISCONTINUED] amphetamine-dextroamphetamine (ADDERALL) 10 MG tablet Take 1 tablet (10 mg total) by mouth 2 (two) times daily.   [DISCONTINUED] amphetamine-dextroamphetamine  (ADDERALL) 10 MG tablet Take 1 tablet (10 mg total) by mouth 2 (two) times daily.   [DISCONTINUED] amphetamine-dextroamphetamine (ADDERALL) 10 MG tablet Take 1 tablet (10 mg total) by mouth 2 (two) times daily.   No facility-administered medications prior to visit.    Review of Systems per HPI     Objective    BP 121/79 (BP Location: Left Arm, Patient Position: Sitting, Cuff Size: Large)    Pulse 92    Temp 98.2 F (36.8 C) (Temporal)    Resp 16    Wt 146 lb 4.8 oz (66.4 kg)    SpO2 99%    BMI 25.92 kg/m    Physical Exam Vitals reviewed.  Constitutional:      General: She is not in acute distress.    Appearance: Normal appearance. She is well-developed. She is not diaphoretic.  HENT:     Head: Normocephalic and atraumatic.  Eyes:     General: No scleral icterus.    Conjunctiva/sclera: Conjunctivae normal.  Neck:     Thyroid: No thyromegaly.  Cardiovascular:     Rate and Rhythm: Normal rate and regular rhythm.     Pulses: Normal pulses.     Heart sounds: Normal heart sounds. No murmur heard. Pulmonary:     Effort: Pulmonary effort is normal. No respiratory distress.     Breath sounds: Normal breath sounds. No wheezing, rhonchi or rales.  Musculoskeletal:     Cervical back: Neck supple.     Right lower leg: No edema.     Left lower leg: No edema.  Lymphadenopathy:     Cervical: No cervical adenopathy.  Skin:    General: Skin is warm and dry.     Findings: No rash.  Neurological:     Mental Status: She is alert and oriented to person, place, and time. Mental status is at baseline.  Psychiatric:        Mood and Affect: Mood normal.        Behavior: Behavior normal.      No results found for any visits on 01/26/22.  Assessment & Plan     Problem List Items Addressed This Visit       Cardiovascular and Mediastinum   Primary hypertension    Well controlled Continue current medications        Other   Compulsive tobacco user syndrome    Remains  precontemplative Recommend cessation      Attention deficit hyperactivity disorder (ADHD), combined type - Primary    Poorly controlled Did not tolerate adderall or adderall XR due to jitteriness Trial of Vyvanse 20 mg daily F/u in 2-3 months        Return in about 2 months (around 03/26/2022) for CPE, as scheduled.      I, Lavon Paganini, MD, have reviewed all documentation for this visit. The documentation on 01/26/22 for the exam, diagnosis, procedures, and orders are all accurate and complete.   Eland Lamantia, Dionne Bucy, MD, MPH [facility]

## 2022-01-29 ENCOUNTER — Encounter: Payer: Self-pay | Admitting: Family Medicine

## 2022-01-30 MED ORDER — METHYLPHENIDATE HCL ER (OSM) 18 MG PO TBCR: 18.0000 mg | EXTENDED_RELEASE_TABLET | Freq: Every day | ORAL | 0 refills | Status: DC

## 2022-02-05 MED ORDER — AMPHETAMINE-DEXTROAMPHETAMINE 15 MG PO TABS: 15.0000 mg | ORAL_TABLET | Freq: Two times a day (BID) | ORAL | 0 refills | Status: DC

## 2022-02-05 NOTE — Addendum Note (Signed)
Addended by: Virginia Crews on: 02/05/2022 09:27 AM ? ? Modules accepted: Orders ? ?

## 2022-02-26 ENCOUNTER — Encounter: Payer: Self-pay | Admitting: Family Medicine

## 2022-03-02 NOTE — Telephone Encounter (Signed)
It's because she couldn't afford, so we discontinued it and sent something else in.  We can try to do the PA if it is received.

## 2022-03-05 ENCOUNTER — Other Ambulatory Visit: Payer: Self-pay | Admitting: Family Medicine

## 2022-03-05 MED ORDER — HYDROCHLOROTHIAZIDE 12.5 MG PO TABS: 12.5000 mg | ORAL_TABLET | Freq: Every day | ORAL | 3 refills | Status: AC

## 2022-03-05 MED ORDER — LISDEXAMFETAMINE DIMESYLATE 20 MG PO CAPS: 20.0000 mg | ORAL_CAPSULE | Freq: Every day | ORAL | 0 refills | Status: AC

## 2022-03-06 MED ORDER — AMPHETAMINE-DEXTROAMPHETAMINE 15 MG PO TABS
15.0000 mg | ORAL_TABLET | Freq: Two times a day (BID) | ORAL | 0 refills | Status: DC
Start: 2022-03-06 — End: 2023-04-28

## 2022-03-09 ENCOUNTER — Telehealth: Payer: Self-pay

## 2022-03-09 NOTE — Telephone Encounter (Signed)
Ok to let patient know. We'll continue the adderall.

## 2022-03-09 NOTE — Telephone Encounter (Signed)
PA for Vyvanse was denies on 03/06/22 ?

## 2022-03-10 NOTE — Telephone Encounter (Signed)
Advised patient

## 2022-03-10 NOTE — Telephone Encounter (Signed)
Patient called in to speak to Jiles Garter say that she spoke to insurance and they stated that the reason that the medication prior auth was denied is because they did not get enough information from the Drs office. Please call patient at Ph# [phone] ?

## 2022-03-11 NOTE — Telephone Encounter (Signed)
Lm advising that office notes with trial and failed medications sent [facility].  ?

## 2022-03-12 NOTE — Telephone Encounter (Signed)
[facility] provider service and spoke to [place] who confirmed that they have received fax with office notes. She reports that it could take up to 30 days to review for outcome.  ?

## 2022-03-17 NOTE — Telephone Encounter (Signed)
PA was resumitted as appeal was going to take longer than expected. Spoke to a rep at [place] @ [phone] today and she suggested to just do a new PA.  ?

## 2022-03-18 NOTE — Telephone Encounter (Signed)
PA denied for Vyvanse.  ?

## 2022-03-19 ENCOUNTER — Encounter: Payer: 59 | Admitting: Family Medicine

## 2022-04-01 ENCOUNTER — Other Ambulatory Visit: Payer: Self-pay

## 2022-04-02 ENCOUNTER — Other Ambulatory Visit: Payer: Self-pay

## 2022-04-21 ENCOUNTER — Ambulatory Visit (INDEPENDENT_AMBULATORY_CARE_PROVIDER_SITE_OTHER): Payer: 59

## 2022-04-21 ENCOUNTER — Encounter: Payer: Self-pay | Admitting: Cardiology

## 2022-04-21 ENCOUNTER — Ambulatory Visit (INDEPENDENT_AMBULATORY_CARE_PROVIDER_SITE_OTHER): Payer: 59 | Admitting: Cardiology

## 2022-04-21 VITALS — BP 120/80 | HR 92 | Ht 63.0 in | Wt 146.2 lb

## 2022-04-21 DIAGNOSIS — R001 Bradycardia, unspecified: Secondary | ICD-10-CM | POA: Diagnosis not present

## 2022-04-21 DIAGNOSIS — I10 Essential (primary) hypertension: Secondary | ICD-10-CM

## 2022-04-21 DIAGNOSIS — F17200 Nicotine dependence, unspecified, uncomplicated: Secondary | ICD-10-CM

## 2022-04-21 NOTE — Patient Instructions (Addendum)
Medication Instructions:   Your physician recommends that you continue on your current medications as directed. Please refer to the Current Medication list given to you today.  *If you need a refill on your cardiac medications before your next appointment, please call your pharmacy*   Lab Work:  None ordered  Testing/Procedures:  Your physician has recommended that you wear a Zio XT monitor for 2 weeks. This will be mailed to your home address in 4-5 business days.   This monitor is a medical device that records the heart's electrical activity. Doctors most often use these monitors to diagnose arrhythmias. Arrhythmias are problems with the speed or rhythm of the heartbeat. The monitor is a small device applied to your chest. You can wear one while you do your normal daily activities. While wearing this monitor if you have any symptoms to push the button and record what you felt. Once you have worn this monitor for the period of time provider prescribed (Usually 14 days), you will return the monitor device in the postage paid box. Once it is returned they will download the data collected and provide [place] with a report which the provider will then review and we will call you with those results. Important tips:  Avoid showering during the first 24 hours of wearing the monitor. Avoid excessive sweating to help maximize wear time. Do not submerge the device, no hot tubs, and no swimming pools. Keep any lotions or oils away from the patch. After 24 hours you may shower with the patch on. Take brief showers with your back facing the shower head.  Do not remove patch once it has been placed because that will interrupt data and decrease adhesive wear time. Push the button when you have any symptoms and write down what you were feeling. Once you have completed wearing your monitor, remove and place into box which has postage paid and place in your outgoing mailbox.  If for some reason you have misplaced  your box then call our office and we can provide another box and/or mail it off for you.    Follow-Up: At [facility], you and your health needs are our priority.  As part of our continuing mission to provide you with exceptional heart care, we have created designated Provider Care Teams.  These Care Teams include your primary Cardiologist (physician) and Advanced Practice Providers (APPs -  Physician Assistants and Nurse Practitioners) who all work together to provide you with the care you need, when you need it.  We recommend signing up for the patient portal called "MyChart".  Sign up information is provided on this After Visit Summary.  MyChart is used to connect with patients for Virtual Visits (Telemedicine).  Patients are able to view lab/test results, encounter notes, upcoming appointments, etc.  Non-urgent messages can be sent to your provider as well.   To learn more about what you can do with MyChart, go to [email or web address].    Your next appointment:   6-8 week(s)  The format for your next appointment:   In Person  Provider:   You may see Kate Sable, MD or one of the following Advanced Practice Providers on your designated Care Team:   Murray Hodgkins, NP Christell Faith, PA-C Cadence Kathlen Mody, [place]    Other Instructions   Important Information About [place]

## 2022-04-21 NOTE — Progress Notes (Signed)
Cardiology Office Note:    Date:  04/21/2022   ID:  Heather Harrell, DOB 1981/03/18, MRN [id number]  PCP:  Celene Squibb, MD   [facility] Providers Cardiologist:  None     Referring MD: Virginia Crews, MD   Chief Complaint  Patient presents with   Other    Bradycardia c/o low HR. Meds reviewed verbally with pt.   Heather Harrell is a 41 y.o. female who is being seen today for the evaluation of bradycardia at the request of Bacigalupo, Dionne Bucy, MD.   History of Present Illness:    Heather Harrell is a 41 y.o. female with a hx of ADHD, hypertension, current smoker x20+ years presenting with bradycardia.  Patient states having low heart rates in the 40s occasionally, she denies palpitations or dizziness.  She knows her heart rate is low because her smart watch gives her a notice.  Denies any history of heart disease.  Does not take any AV nodal agents.  Able to perform all activities of daily living without adverse effects.  Blood pressure adequately controlled on current medications.  Recently spraining her right thumb lifting objects.  Past Medical History:  Diagnosis Date   Acute bronchitis 09/06/2007   Acute onset aura migraine 09/22/2006   Anemia    Biliary calculi 02/26/2016   Blood transfusion without reported diagnosis    2005   Calculus of kidney 02/26/2016   Chronic infection of sinus 12/07/2008   Family history of breast cancer    Family history of lung cancer    Female genital symptoms 11/26/2006   ASSESSMENT: Assume UTI as etiology of Sx. Pain in LLQ is less today. If pain persist or does not improve on antibiotic, will proceed with pelvic US to R/O ovarian cyst. Instructed pt that if pain fot worse or she developed fever to report to ER. Pt verb understanding.     Past Surgical History:  Procedure Laterality Date   CESAREAN SECTION     3   TUBAL LIGATION  2012    Current Medications: Current Meds  Medication Sig   Accu-Chek Softclix  Lancets lancets Use as directed   Blood Glucose Monitoring Suppl (ACCU-CHEK NANO SMARTVIEW) w/Device KIT Use as directed   glucose blood (ACCU-CHEK SMARTVIEW) test strip Use as instructed   hydrochlorothiazide (HYDRODIURIL) 12.5 MG tablet Take 1 tablet (12.5 mg total) by mouth daily.   ibuprofen (ADVIL,MOTRIN) 800 MG tablet Take 1 tablet (800 mg total) by mouth every 6 (six) hours as needed.   lisdexamfetamine (VYVANSE) 20 MG capsule Take 1 capsule (20 mg total) by mouth daily.   ondansetron (ZOFRAN-ODT) 8 MG disintegrating tablet Take 1 tablet (8 mg total) by mouth every 8 (eight) hours as needed.   SUMAtriptan (IMITREX) 50 MG tablet Take 1 tablet (50 mg total) by mouth every 2 (two) hours as needed for migraine. May repeat in 2 hours if headache persists or recurs.     Allergies:   Ivp dye [iodinated contrast media] and Codeine   Social History   Socioeconomic History   Marital status: Married    Spouse name: Not on file   Number of children: Not on file   Years of education: Not on file   Highest education level: Not on file  Occupational History   Not on file  Tobacco Use   Smoking status: Every Day    Packs/day: 1.00    Years: 26.00    Pack years: 26.00  Types: Cigarettes   Smokeless tobacco: Never  Vaping Use   Vaping Use: Some days  Substance and Sexual Activity   Alcohol use: No    Alcohol/week: 0.0 standard drinks   Drug use: No   Sexual activity: Not on file  Other Topics Concern   Not on file  Social History Narrative   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; 1 ppd/ day; [cutting down]; ocassional alcohol;    Social Determinants of Health   Financial Resource Strain: Not on file  Food Insecurity: Not on file  Transportation Needs: Not on file  Physical Activity: Not on file  Stress: Not on file  Social Connections: Not on file     Family History: The patient's family history includes Breast cancer in her maternal grandmother and paternal aunt; Breast cancer (age of  onset: 26) in her maternal aunt; Breast cancer (age of onset: 66) in her mother; Dementia in her maternal grandmother and paternal aunt; Diabetes in her mother; Heart attack in her mother; Kidney failure in her mother; Lung cancer (age of onset: 85) in her father.  ROS:   Please see the history of present illness.     All other systems reviewed and are negative.  EKGs/Labs/Other Studies Reviewed:    The following studies were reviewed today:   EKG:  EKG is  ordered today.  The ekg ordered today demonstrates normal sinus rhythm, normal ECG, heart rate 92  Recent Labs: 10/16/2021: ALT 8; Hemoglobin 13.2; Platelets 429; TSH 0.709 11/10/2021: BUN 10; Creatinine, Ser 0.66; Potassium 4.0; Sodium 140  Recent Lipid Panel    Component Value Date/Time   CHOL 172 04/10/2021 0856   TRIG 56 04/10/2021 0856   HDL 61 04/10/2021 0856   CHOLHDL 2.8 04/10/2021 0856   LDLCALC 100 (H) 04/10/2021 0856     Risk Assessment/Calculations:         Physical Exam:    VS:  BP 120/80 (BP Location: Right Arm, Patient Position: Sitting, Cuff Size: Normal)   Pulse 92   Ht _0  (1.6 m)   Wt 146 lb 4 oz (66.3 kg)   SpO2 98%   BMI 25.91 kg/m     Wt Readings from Last 3 Encounters:  04/21/22 146 lb 4 oz (66.3 kg)  01/26/22 146 lb 4.8 oz (66.4 kg)  11/10/21 141 lb 1.6 oz (64 kg)     GEN:  Well nourished, well developed in no acute distress HEENT: Normal NECK: No JVD; No carotid bruits LYMPHATICS: No lymphadenopathy CARDIAC: RRR, no murmurs, rubs, gallops RESPIRATORY:  Clear to auscultation without rales, wheezing or rhonchi  ABDOMEN: Soft, non-tender, non-distended MUSCULOSKELETAL:  No edema; right hand/thumb in brace SKIN: Warm and dry NEUROLOGIC:  Alert and oriented x 3 PSYCHIATRIC:  Normal affect   ASSESSMENT:    1. Bradycardia   2. Primary hypertension   3. Smoking    PLAN:    In order of problems listed above:  History of bradycardia, EKG today showed normal sinus rhythm, heart  rate 92.  Place cardiac monitor to rule out any high degree AV block or conduction abnormalities.  Patient otherwise clinically asymptomatic. Hypertension, BP controlled, continue HCTZ. Current smoker, smoking cessation advised.  Follow-up after cardiac monitor       Medication Adjustments/Labs and Tests Ordered: Current medicines are reviewed at length with the patient today.  Concerns regarding medicines are outlined above.  Orders Placed This Encounter  Procedures   LONG TERM MONITOR (3-14 DAYS)   EKG 12-Lead  No orders of the defined types were placed in this encounter.   Patient Instructions  Medication Instructions:   Your physician recommends that you continue on your current medications as directed. Please refer to the Current Medication list given to you today.  *If you need a refill on your cardiac medications before your next appointment, please call your pharmacy*   Lab Work:  None ordered  Testing/Procedures:  Your physician has recommended that you wear a Zio XT monitor for 2 weeks. This will be mailed to your home address in 4-5 business days.   This monitor is a medical device that records the heart's electrical activity. Doctors most often use these monitors to diagnose arrhythmias. Arrhythmias are problems with the speed or rhythm of the heartbeat. The monitor is a small device applied to your chest. You can wear one while you do your normal daily activities. While wearing this monitor if you have any symptoms to push the button and record what you felt. Once you have worn this monitor for the period of time provider prescribed (Usually 14 days), you will return the monitor device in the postage paid box. Once it is returned they will download the data collected and provide [place] with a report which the provider will then review and we will call you with those results. Important tips:  Avoid showering during the first 24 hours of wearing the monitor. Avoid  excessive sweating to help maximize wear time. Do not submerge the device, no hot tubs, and no swimming pools. Keep any lotions or oils away from the patch. After 24 hours you may shower with the patch on. Take brief showers with your back facing the shower head.  Do not remove patch once it has been placed because that will interrupt data and decrease adhesive wear time. Push the button when you have any symptoms and write down what you were feeling. Once you have completed wearing your monitor, remove and place into box which has postage paid and place in your outgoing mailbox.  If for some reason you have misplaced your box then call our office and we can provide another box and/or mail it off for you.    Follow-Up: At [facility], you and your health needs are our priority.  As part of our continuing mission to provide you with exceptional heart care, we have created designated Provider Care Teams.  These Care Teams include your primary Cardiologist (physician) and Advanced Practice Providers (APPs -  Physician Assistants and Nurse Practitioners) who all work together to provide you with the care you need, when you need it.  We recommend signing up for the patient portal called "MyChart".  Sign up information is provided on this After Visit Summary.  MyChart is used to connect with patients for Virtual Visits (Telemedicine).  Patients are able to view lab/test results, encounter notes, upcoming appointments, etc.  Non-urgent messages can be sent to your provider as well.   To learn more about what you can do with MyChart, go to [email or web address].    Your next appointment:   6-8 week(s)  The format for your next appointment:   In Person  Provider:   You may see Kate Sable, MD or one of the following Advanced Practice Providers on your designated Care Team:   Murray Hodgkins, NP Christell Faith, PA-C Cadence Kathlen Mody, [place]    Other Instructions   Important Information  About Sugar         Signed, Kate Sable, MD  04/21/2022 9:14 AM    [facility]

## 2022-04-23 DIAGNOSIS — R001 Bradycardia, unspecified: Secondary | ICD-10-CM

## 2022-06-01 NOTE — Progress Notes (Deleted)
Cardiology Clinic Note   Patient Name: Heather Harrell Date of Encounter: 06/01/2022  Primary Care Provider:  Celene Squibb, MD Primary Cardiologist:  Kate Sable, MD  Patient Profile    41 year old female with a history of ADHD, essential hypertension, current smoker x20+ years, and bradycardia who is here today to follow-up on her bradycardia.  Past Medical History    Past Medical History:  Diagnosis Date   Acute bronchitis 09/06/2007   Acute onset aura migraine 09/22/2006   Anemia    Biliary calculi 02/26/2016   Blood transfusion without reported diagnosis    2005   Calculus of kidney 02/26/2016   Chronic infection of sinus 12/07/2008   Family history of breast cancer    Family history of lung cancer    Female genital symptoms 11/26/2006   ASSESSMENT: Assume UTI as etiology of Sx. Pain in LLQ is less today. If pain persist or does not improve on antibiotic, will proceed with pelvic US to R/O ovarian cyst. Instructed pt that if pain fot worse or she developed fever to report to ER. Pt verb understanding.    Past Surgical History:  Procedure Laterality Date   CESAREAN SECTION     3   TUBAL LIGATION  2012    Allergies  Allergies  Allergen Reactions   Ivp Dye [Iodinated Contrast Media] Itching   Codeine Itching and Hives    History of Present Illness      Home Medications    Current Outpatient Medications  Medication Sig Dispense Refill   Accu-Chek Softclix Lancets lancets Use as directed 100 each 1   amphetamine-dextroamphetamine (ADDERALL) 15 MG tablet Take 1 tablet by mouth 2 (two) times daily. (Patient not taking: Reported on 04/21/2022) 60 tablet 0   Blood Glucose Monitoring Suppl (ACCU-CHEK NANO SMARTVIEW) w/Device KIT Use as directed 1 kit 0   glucose blood (ACCU-CHEK SMARTVIEW) test strip Use as instructed 100 each 1   hydrochlorothiazide (HYDRODIURIL) 12.5 MG tablet Take 1 tablet (12.5 mg total) by mouth daily. 30 tablet 3   ibuprofen  (ADVIL,MOTRIN) 800 MG tablet Take 1 tablet (800 mg total) by mouth every 6 (six) hours as needed. 30 tablet 0   lisdexamfetamine (VYVANSE) 20 MG capsule Take 1 capsule (20 mg total) by mouth daily. 30 capsule 0   ondansetron (ZOFRAN-ODT) 8 MG disintegrating tablet Take 1 tablet (8 mg total) by mouth every 8 (eight) hours as needed. 20 tablet 0   SUMAtriptan (IMITREX) 50 MG tablet Take 1 tablet (50 mg total) by mouth every 2 (two) hours as needed for migraine. May repeat in 2 hours if headache persists or recurs. 10 tablet 11   No current facility-administered medications for this visit.     Family History    Family History  Problem Relation Age of Onset   Heart attack Mother    Breast cancer Mother 72   Diabetes Mother    Kidney failure Mother    Lung cancer Father 23   Breast cancer Maternal Aunt 62   Dementia Paternal Aunt    Breast cancer Paternal Aunt        d. 21s   Breast cancer Maternal Grandmother        dx 12, d. 38s   Dementia Maternal Grandmother    She indicated that her mother is alive. She indicated that her father is deceased. She indicated that her maternal grandmother is deceased. She indicated that her maternal grandfather is deceased. She indicated that her  paternal grandmother is deceased. She indicated that her paternal grandfather is deceased. She indicated that her daughter is alive. She indicated that her maternal aunt is deceased. She indicated that one of her two paternal aunts is deceased. She indicated that her half-brother is alive.  Social History    Social History   Socioeconomic History   Marital status: Married    Spouse name: Not on file   Number of children: Not on file   Years of education: Not on file   Highest education level: Not on file  Occupational History   Not on file  Tobacco Use   Smoking status: Every Day    Packs/day: 1.00    Years: 26.00    Total pack years: 26.00    Types: Cigarettes   Smokeless tobacco: Never  Vaping Use    Vaping Use: Some days  Substance and Sexual Activity   Alcohol use: No    Alcohol/week: 0.0 standard drinks of alcohol   Drug use: No   Sexual activity: Not on file  Other Topics Concern   Not on file  Social History Narrative   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  Cardiovascular:  No chest pain, dyspnea on exertion, edema, orthopnea, palpitations, paroxysmal nocturnal dyspnea. Dermatological: No rash, lesions/masses Respiratory: No cough, dyspnea Urologic: No hematuria, dysuria Abdominal:   No nausea, vomiting, diarrhea, bright red blood per rectum, melena, or hematemesis Neurologic:  No visual changes, wkns, changes in mental status. All other systems reviewed and are otherwise negative except as noted above.     Physical Exam    VS:  There were no vitals taken for this visit. , BMI There is no height or weight on file to calculate BMI.     GEN: Well nourished, well developed, in no acute distress. HEENT: normal. Neck: Supple, no JVD, carotid bruits, or masses. Cardiac: RRR, no murmurs, rubs, or gallops. No clubbing, cyanosis, edema.  Radials/DP/PT 2+ and equal bilaterally.  Respiratory:  Respirations regular and unlabored, clear to auscultation bilaterally. GI: Soft, nontender, nondistended, BS + x 4. MS: no deformity or atrophy. Skin: warm and dry, no rash. Neuro:  Strength and sensation are intact. Psych: Normal affect.  Accessory Clinical Findings    ECG personally reviewed by me today- *** - No acute changes  Lab Results  Component Value Date   WBC 9.5 10/16/2021   HGB  13.2 10/16/2021   HCT 38.3 10/16/2021   MCV 89 10/16/2021   PLT 429 10/16/2021   Lab Results  Component Value Date   CREATININE 0.66 11/10/2021   BUN 10 11/10/2021   NA 140 11/10/2021   K 4.0 11/10/2021   CL 99 11/10/2021   CO2 22 11/10/2021   Lab Results  Component Value Date   ALT 8 10/16/2021   AST 13 10/16/2021   ALKPHOS 78 10/16/2021   BILITOT 0.3 10/16/2021   Lab Results  Component Value Date   CHOL 172 04/10/2021   HDL 61 04/10/2021   LDLCALC 100 (H) 04/10/2021   TRIG 56 04/10/2021  CHOLHDL 2.8 04/10/2021    Lab Results  Component Value Date   HGBA1C 4.9 10/16/2021    Assessment & Plan   1.  ***  Naketa Daddario, NP 06/01/2022, 3:55 PM

## 2022-06-03 ENCOUNTER — Ambulatory Visit: Payer: 59 | Admitting: Medical

## 2022-06-04 ENCOUNTER — Encounter: Payer: Self-pay | Admitting: Medical

## 2022-09-21 ENCOUNTER — Other Ambulatory Visit: Payer: Self-pay | Admitting: Family Medicine

## 2022-09-21 DIAGNOSIS — Z1231 Encounter for screening mammogram for malignant neoplasm of breast: Secondary | ICD-10-CM

## 2022-11-25 ENCOUNTER — Ambulatory Visit
Admission: RE | Admit: 2022-11-25 | Discharge: 2022-11-25 | Disposition: A | Discharge status: Home / Self Care | Payer: Medicaid Other | Source: Ambulatory Visit | Attending: Family Medicine | Admitting: Family Medicine

## 2022-11-25 DIAGNOSIS — Z1231 Encounter for screening mammogram for malignant neoplasm of breast: Secondary | ICD-10-CM | POA: Diagnosis not present

## 2023-01-15 ENCOUNTER — Telehealth: Payer: Self-pay | Admitting: Psychology

## 2023-01-15 NOTE — Telephone Encounter (Signed)
This Probation officer contacted Heather Harrell as he received a message from the front desk indicating she had called for "some suggestions." She was unable to be reached. A HIPAA-compliant voicemail with no patient identifying information was left.

## 2023-04-28 ENCOUNTER — Ambulatory Visit (INDEPENDENT_AMBULATORY_CARE_PROVIDER_SITE_OTHER): Payer: Medicaid Other | Admitting: Dermatology

## 2023-04-28 ENCOUNTER — Other Ambulatory Visit: Payer: Self-pay

## 2023-04-28 VITALS — BP 124/85 | HR 95

## 2023-04-28 DIAGNOSIS — L729 Follicular cyst of the skin and subcutaneous tissue, unspecified: Secondary | ICD-10-CM | POA: Diagnosis not present

## 2023-04-28 NOTE — Patient Instructions (Signed)
Due to recent changes in healthcare laws, you may see results of your pathology and/or laboratory studies on MyChart before the doctors have had a chance to review them. We understand that in some cases there may be results that are confusing or concerning to you. Please understand that not all results are received at the same time and often the doctors may need to interpret multiple results in order to provide you with the best plan of care or course of treatment. Therefore, we ask that you please give us 2 business days to thoroughly review all your results before contacting the office for clarification. Should we see a critical lab result, you will be contacted sooner.   If You Need Anything After Your Visit  If you have any questions or concerns for your doctor, please call our main line at [phone] and press option 4 to reach your doctor's medical assistant. If no one answers, please leave a voicemail as directed and we will return your call as soon as possible. Messages left after 4 pm will be answered the following business day.   You may also send us a message via MyChart. We typically respond to MyChart messages within 1-2 business days.  For prescription refills, please ask your pharmacy to contact our office. Our fax number is [phone].  If you have an urgent issue when the clinic is closed that cannot wait until the next business day, you can page your doctor at the number below.    Please note that while we do our best to be available for urgent issues outside of office hours, we are not available 24/7.   If you have an urgent issue and are unable to reach us, you may choose to seek medical care at your doctor's office, retail clinic, urgent care center, or emergency room.  If you have a medical emergency, please immediately call 911 or go to the emergency department.  Pager Numbers  - Dr. Kowalski: [phone]  - Dr. Moye: [phone]  - Dr. Stewart:  [phone]  In the event of inclement weather, please call our main line at [phone] for an update on the status of any delays or closures.  Dermatology Medication Tips: Please keep the boxes that topical medications come in in order to help keep track of the instructions about where and how to use these. Pharmacies typically print the medication instructions only on the boxes and not directly on the medication tubes.   If your medication is too expensive, please contact our office at [phone] option 4 or send us a message through MyChart.   We are unable to tell what your co-pay for medications will be in advance as this is different depending on your insurance coverage. However, we may be able to find a substitute medication at lower cost or fill out paperwork to get insurance to cover a needed medication.   If a prior authorization is required to get your medication covered by your insurance company, please allow us 1-2 business days to complete this process.  Drug prices often vary depending on where the prescription is filled and some pharmacies may offer cheaper prices.  The website www.goodrx.com contains coupons for medications through different pharmacies. The prices here do not account for what the cost may be with help from insurance (it may be cheaper with your insurance), but the website can give you the price if you did not use any insurance.  - You can print the associated coupon and take it with   your prescription to the pharmacy.  - You may also stop by our office during regular business hours and pick up a GoodRx coupon card.  - If you need your prescription sent electronically to a different pharmacy, notify our office through [facility] MyChart or by phone at [phone] option 4.     Si Usted Necesita Algo Despus de Su Visita  Tambin puede enviarnos un mensaje a travs de MyChart. Por lo general respondemos a los mensajes de MyChart en el transcurso de 1 a 2  das hbiles.  Para renovar recetas, por favor pida a su farmacia que se ponga en contacto con nuestra oficina. Nuestro nmero de fax es el [phone].  Si tiene un asunto urgente cuando la clnica est cerrada y que no puede esperar hasta el siguiente da hbil, puede llamar/localizar a su doctor(a) al nmero que aparece a continuacin.   Por favor, tenga en cuenta que aunque hacemos todo lo posible para estar disponibles para asuntos urgentes fuera del horario de oficina, no estamos disponibles las 24 horas del da, los 7 das de la semana.   Si tiene un problema urgente y no puede comunicarse con nosotros, puede optar por buscar atencin mdica  en el consultorio de su doctor(a), en una clnica privada, en un centro de atencin urgente o en una sala de emergencias.  Si tiene una emergencia mdica, por favor llame inmediatamente al 911 o vaya a la sala de emergencias.  Nmeros de bper  - Dr. Kowalski: [phone]  - Dra. Moye: [phone]  - Dra. Stewart: [phone]  En caso de inclemencias del tiempo, por favor llame a nuestra lnea principal al [phone] para una actualizacin sobre el estado de cualquier retraso o cierre.  Consejos para la medicacin en dermatologa: Por favor, guarde las cajas en las que vienen los medicamentos de uso tpico para ayudarle a seguir las instrucciones sobre dnde y cmo usarlos. Las farmacias generalmente imprimen las instrucciones del medicamento slo en las cajas y no directamente en los tubos del medicamento.   Si su medicamento es muy caro, por favor, pngase en contacto con nuestra oficina llamando al [phone] y presione la opcin 4 o envenos un mensaje a travs de MyChart.   No podemos decirle cul ser su copago por los medicamentos por adelantado ya que esto es diferente dependiendo de la cobertura de su seguro. Sin embargo, es posible que podamos encontrar un medicamento sustituto a menor costo o llenar un formulario para que el  seguro cubra el medicamento que se considera necesario.   Si se requiere una autorizacin previa para que su compaa de seguros cubra su medicamento, por favor permtanos de 1 a 2 das hbiles para completar este proceso.  Los precios de los medicamentos varan con frecuencia dependiendo del lugar de dnde se surte la receta y alguna farmacias pueden ofrecer precios ms baratos.  El sitio web www.goodrx.com tiene cupones para medicamentos de diferentes farmacias. Los precios aqu no tienen en cuenta lo que podra costar con la ayuda del seguro (puede ser ms barato con su seguro), pero el sitio web puede darle el precio si no utiliz ningn seguro.  - Puede imprimir el cupn correspondiente y llevarlo con su receta a la farmacia.  - Tambin puede pasar por nuestra oficina durante el horario de atencin regular y recoger una tarjeta de cupones de GoodRx.  - Si necesita que su receta se enve electrnicamente a una farmacia diferente, informe a nuestra oficina a travs de MyChart de [place]   o por telfono llamando al [phone] y presione la opcin 4.  

## 2023-04-28 NOTE — Progress Notes (Signed)
   Follow-Up Visit   Subjective  Heather Harrell is a 42 y.o. female who presents for the following: Growth of the right upper thigh/inguinal crease, present since December and getting larger, sometimes sore. Patient squeezes area a couple times a week.   The following portions of the chart were reviewed this encounter and updated as appropriate: medications, allergies, medical history  Review of Systems:  No other skin or systemic complaints except as noted in HPI or Assessment and Plan.  Objective  Well appearing patient in no apparent distress; mood and affect are within normal limits.  A focused examination was performed of the following areas: Face, inguinal crease  Relevant physical exam findings are noted in the Assessment and Plan.    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 Exam most consistent with an epidermal inclusion cyst. Discussed that a cyst is a benign growth that can grow over time and sometimes get irritated or inflamed. Recommend observation if it is not bothersome. Discussed option of surgical excision to remove it if it is growing, symptomatic, or other changes noted.   Area is irritating to patient and she would like to have removed. Will refer patient to a general surgeon for excision.    Return if symptoms worsen or fail to improve.  ICherlyn Labella, CMA, am acting as scribe for Willeen Niece, MD .   Documentation: I have reviewed the above documentation for accuracy and completeness, and I agree with the above.  Willeen Niece, MD

## 2023-05-04 ENCOUNTER — Other Ambulatory Visit: Payer: Self-pay | Admitting: *Deleted

## 2023-05-04 ENCOUNTER — Ambulatory Visit: Payer: Managed Care, Other (non HMO) | Admitting: Urology

## 2023-05-04 DIAGNOSIS — R300 Dysuria: Secondary | ICD-10-CM

## 2023-05-25 ENCOUNTER — Ambulatory Visit (INDEPENDENT_AMBULATORY_CARE_PROVIDER_SITE_OTHER): Payer: Medicaid Other | Admitting: Dermatology

## 2023-05-25 ENCOUNTER — Encounter: Payer: Self-pay | Admitting: Dermatology

## 2023-05-25 VITALS — BP 140/98 | HR 89

## 2023-05-25 DIAGNOSIS — L820 Inflamed seborrheic keratosis: Secondary | ICD-10-CM | POA: Diagnosis not present

## 2023-05-25 DIAGNOSIS — L821 Other seborrheic keratosis: Secondary | ICD-10-CM

## 2023-05-25 DIAGNOSIS — L814 Other melanin hyperpigmentation: Secondary | ICD-10-CM | POA: Diagnosis not present

## 2023-05-25 NOTE — Progress Notes (Signed)
   Follow-Up Visit   Subjective  Heather Harrell is a 42 y.o. female who presents for the following: Spot below left eye. Noticed last week. Itches. Non tender, denies bleeding.   The patient has spots, moles and lesions to be evaluated, some may be new or changing and the patient has concerns that these could be cancer.     The following portions of the chart were reviewed this encounter and updated as appropriate: medications, allergies, medical history  Review of Systems:  No other skin or systemic complaints except as noted in HPI or Assessment and Plan.  Objective  Well appearing patient in no apparent distress; mood and affect are within normal limits.  A focused examination was performed of the following areas: Face   Relevant physical exam findings are noted in the Assessment and Plan.  Left Malar Cheek x1 Erythematous keratotic or waxy stuck-on papule    Assessment & Plan   Inflamed seborrheic keratosis Left Malar Cheek x1  Symptomatic, irritating, patient would like treated.  Destruction of lesion - Left Malar Cheek x1  Destruction method: cryotherapy   Informed consent: discussed and consent obtained   Lesion destroyed using liquid nitrogen: Yes   Region frozen until ice ball extended beyond lesion: Yes   Outcome: patient tolerated procedure well with no complications   Post-procedure details: wound care instructions given   Additional details:  Prior to procedure, discussed risks of blister formation, small wound, skin dyspigmentation, or rare scar following cryotherapy. Recommend Vaseline ointment to treated areas while healing.   SEBORRHEIC KERATOSIS - Stuck-on, waxy, tan-brown papules and/or plaques  - Benign-appearing - Discussed benign etiology and prognosis. - Observe - Call for any changes  LENTIGINES Exam: scattered tan macules Due to sun exposure Treatment Plan: Benign-appearing, observe. Recommend daily broad spectrum sunscreen SPF 30+ to  sun-exposed areas, reapply every 2 hours as needed.  Call for any changes    Return if symptoms worsen or fail to improve.  I, Lawson Radar, CMA, am acting as scribe for Willeen Niece, MD.   Documentation: I have reviewed the above documentation for accuracy and completeness, and I agree with the above.  Willeen Niece, MD

## 2023-05-25 NOTE — Patient Instructions (Signed)
Cryotherapy Aftercare  Wash gently with soap and water everyday.   Apply Vaseline Jelly daily until healed.     Seborrheic Keratosis  What causes seborrheic keratoses? Seborrheic keratoses are harmless, common skin growths that first appear during adult life.  As time goes by, more growths appear.  Some people may develop a large number of them.  Seborrheic keratoses appear on both covered and uncovered body parts.  They are not caused by sunlight.  The tendency to develop seborrheic keratoses can be inherited.  They vary in color from skin-colored to gray, brown, or even black.  They can be either smooth or have a rough, warty surface.   Seborrheic keratoses are superficial and look as if they were stuck on the skin.  Under the microscope this type of keratosis looks like layers upon layers of skin.  That is why at times the top layer may seem to fall off, but the rest of the growth remains and re-grows.    Treatment Seborrheic keratoses do not need to be treated, but can easily be removed in the office.  Seborrheic keratoses often cause symptoms when they rub on clothing or jewelry.  Lesions can be in the way of shaving.  If they become inflamed, they can cause itching, soreness, or burning.  Removal of a seborrheic keratosis can be accomplished by freezing, burning, or surgery. If any spot bleeds, scabs, or grows rapidly, please return to have it checked, as these can be an indication of a skin cancer.    Recommend daily broad spectrum sunscreen SPF 30+ to sun-exposed areas, reapply every 2 hours as needed. Call for new or changing lesions.  Staying in the shade or wearing long sleeves, sun glasses (UVA+UVB protection) and wide brim hats (4-inch brim around the entire circumference of the hat) are also recommended for sun protection.    Due to recent changes in healthcare laws, you may see results of your pathology and/or laboratory studies on MyChart before the doctors have had a chance to  review them. We understand that in some cases there may be results that are confusing or concerning to you. Please understand that not all results are received at the same time and often the doctors may need to interpret multiple results in order to provide you with the best plan of care or course of treatment. Therefore, we ask that you please give [place] 2 business days to thoroughly review all your results before contacting the office for clarification. Should we see a critical lab result, you will be contacted sooner.   If You Need Anything After Your Visit  If you have any questions or concerns for your doctor, please call our main line at [phone] and press option 4 to reach your doctor's medical assistant. If no one answers, please leave a voicemail as directed and we will return your call as soon as possible. Messages left after 4 pm will be answered the following business day.   You may also send [place] a message via MyChart. We typically respond to MyChart messages within 1-2 business days.  For prescription refills, please ask your pharmacy to contact our office. Our fax number is [phone].  If you have an urgent issue when the clinic is closed that cannot wait until the next business day, you can page your doctor at the number below.    Please note that while we do our best to be available for urgent issues outside of office hours, we are not available 24/7.  If you have an urgent issue and are unable to reach us, you may choose to seek medical care at your doctor's office, retail clinic, urgent care center, or emergency room.  If you have a medical emergency, please immediately call 911 or go to the emergency department.  Pager Numbers  - Dr. Kowalski: [phone]  - Dr. Moye: [phone]  - Dr. Stewart: [phone]  In the event of inclement weather, please call our main line at [phone] for an update on the status of any delays or closures.  Dermatology Medication  Tips: Please keep the boxes that topical medications come in in order to help keep track of the instructions about where and how to use these. Pharmacies typically print the medication instructions only on the boxes and not directly on the medication tubes.   If your medication is too expensive, please contact our office at [phone] option 4 or send us a message through MyChart.   We are unable to tell what your co-pay for medications will be in advance as this is different depending on your insurance coverage. However, we may be able to find a substitute medication at lower cost or fill out paperwork to get insurance to cover a needed medication.   If a prior authorization is required to get your medication covered by your insurance company, please allow us 1-2 business days to complete this process.  Drug prices often vary depending on where the prescription is filled and some pharmacies may offer cheaper prices.  The website www.goodrx.com contains coupons for medications through different pharmacies. The prices here do not account for what the cost may be with help from insurance (it may be cheaper with your insurance), but the website can give you the price if you did not use any insurance.  - You can print the associated coupon and take it with your prescription to the pharmacy.  - You may also stop by our office during regular business hours and pick up a GoodRx coupon card.  - If you need your prescription sent electronically to a different pharmacy, notify our office through [facility] MyChart or by phone at [phone] option 4.     Si Usted Necesita Algo Despus de Su Visita  Tambin puede enviarnos un mensaje a travs de MyChart. Por lo general respondemos a los mensajes de MyChart en el transcurso de 1 a 2 das hbiles.  Para renovar recetas, por favor pida a su farmacia que se ponga en contacto con nuestra oficina. Nuestro nmero de fax es el [phone].  Si tiene un  asunto urgente cuando la clnica est cerrada y que no puede esperar hasta el siguiente da hbil, puede llamar/localizar a su doctor(a) al nmero que aparece a continuacin.   Por favor, tenga en cuenta que aunque hacemos todo lo posible para estar disponibles para asuntos urgentes fuera del horario de oficina, no estamos disponibles las 24 horas del da, los 7 das de la semana.   Si tiene un problema urgente y no puede comunicarse con nosotros, puede optar por buscar atencin mdica  en el consultorio de su doctor(a), en una clnica privada, en un centro de atencin urgente o en una sala de emergencias.  Si tiene una emergencia mdica, por favor llame inmediatamente al 911 o vaya a la sala de emergencias.  Nmeros de bper  - Dr. Kowalski: [phone]  - Dra. Moye: [phone]  - Dra. Stewart: [phone]  En caso de inclemencias del tiempo, por favor llame a nuestra lnea principal   del tiempo, por favor llame a nuestra lnea principal al [phone] para una actualizacin sobre el estado de cualquier retraso o cierre.  Consejos para la medicacin en dermatologa: Por favor, guarde las cajas en las que vienen los medicamentos de uso tpico para ayudarle a seguir las instrucciones sobre dnde y cmo usarlos. Las farmacias generalmente imprimen las instrucciones del medicamento slo en las cajas y no directamente en los tubos del medicamento.   Si su medicamento es muy caro, por favor, pngase en contacto con nuestra oficina llamando al [phone] y presione la opcin 4 o envenos un mensaje a travs de MyChart.   No podemos decirle cul ser su copago por los medicamentos por adelantado ya que esto es diferente dependiendo de la cobertura de su seguro. Sin embargo, es posible que podamos encontrar un medicamento sustituto a menor costo o llenar un formulario para que el seguro cubra el medicamento que se considera necesario.   Si se requiere una autorizacin previa para que su compaa de seguros cubra  su medicamento, por favor permtanos de 1 a 2 das hbiles para completar este proceso.  Los precios de los medicamentos varan con frecuencia dependiendo del lugar de dnde se surte la receta y alguna farmacias pueden ofrecer precios ms baratos.  El sitio web www.goodrx.com tiene cupones para medicamentos de diferentes farmacias. Los precios aqu no tienen en cuenta lo que podra costar con la ayuda del seguro (puede ser ms barato con su seguro), pero el sitio web puede darle el precio si no utiliz ningn seguro.  - Puede imprimir el cupn correspondiente y llevarlo con su receta a la farmacia.  - Tambin puede pasar por nuestra oficina durante el horario de atencin regular y recoger una tarjeta de cupones de GoodRx.  - Si necesita que su receta se enve electrnicamente a una farmacia diferente, informe a nuestra oficina a travs de MyChart de [facility] o por telfono llamando al [phone] y presione la opcin 4.  

## 2023-06-19 IMAGING — MG MM DIGITAL SCREENING BILAT W/ TOMO AND CAD
8 series · 8 of 24 positions shown · non-contrast
Comparison: Previous exam(s).

CLINICAL DATA: Screening.

EXAM:
DIGITAL SCREENING BILATERAL MAMMOGRAM WITH TOMOSYNTHESIS AND CAD
TECHNIQUE: Bilateral screening digital craniocaudal and mediolateral oblique
mammograms were obtained. Bilateral screening digital breast
tomosynthesis was performed. The images were evaluated with
computer-aided detection.

[L MLO synth-2D]
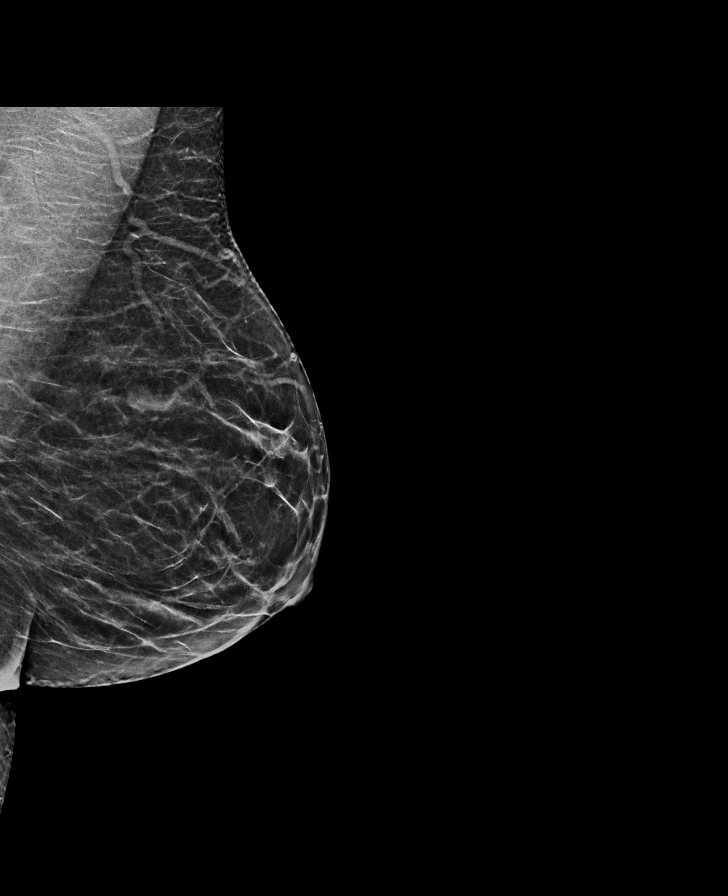

[R MLO synth-2D]
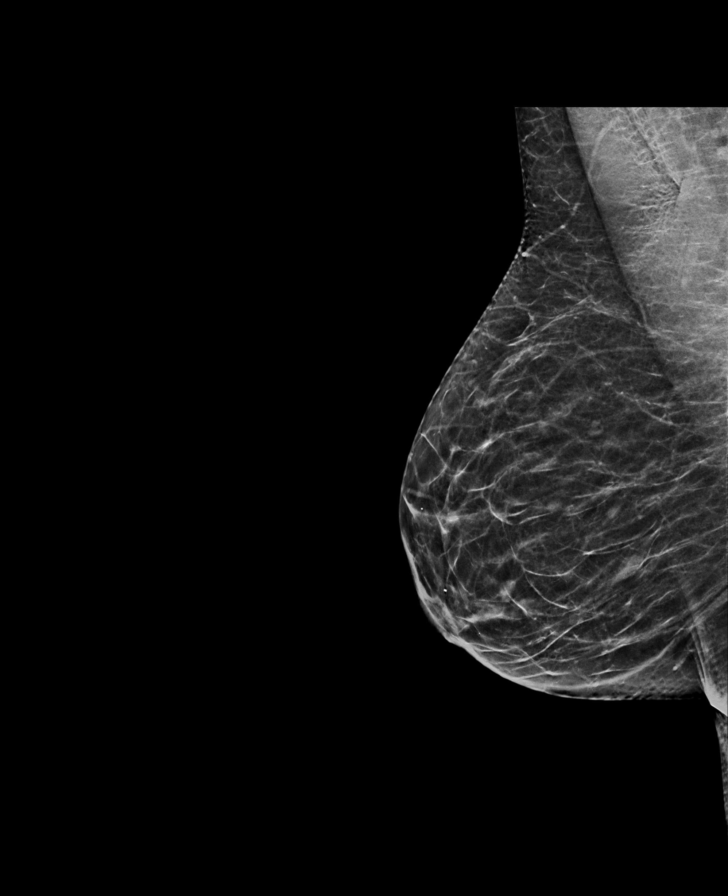

[R CC synth-2D]
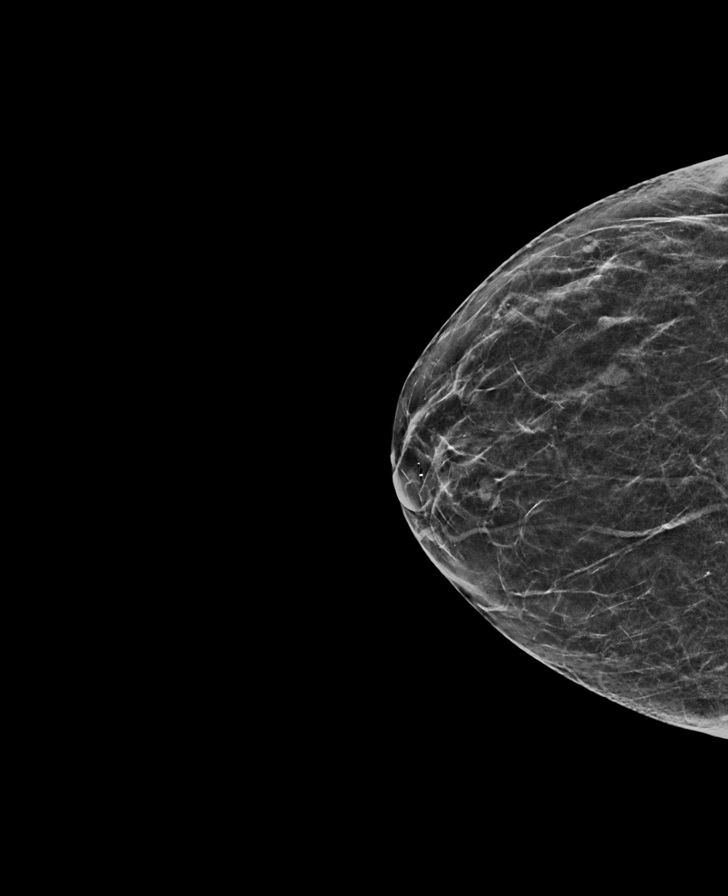

[L CC synth-2D]
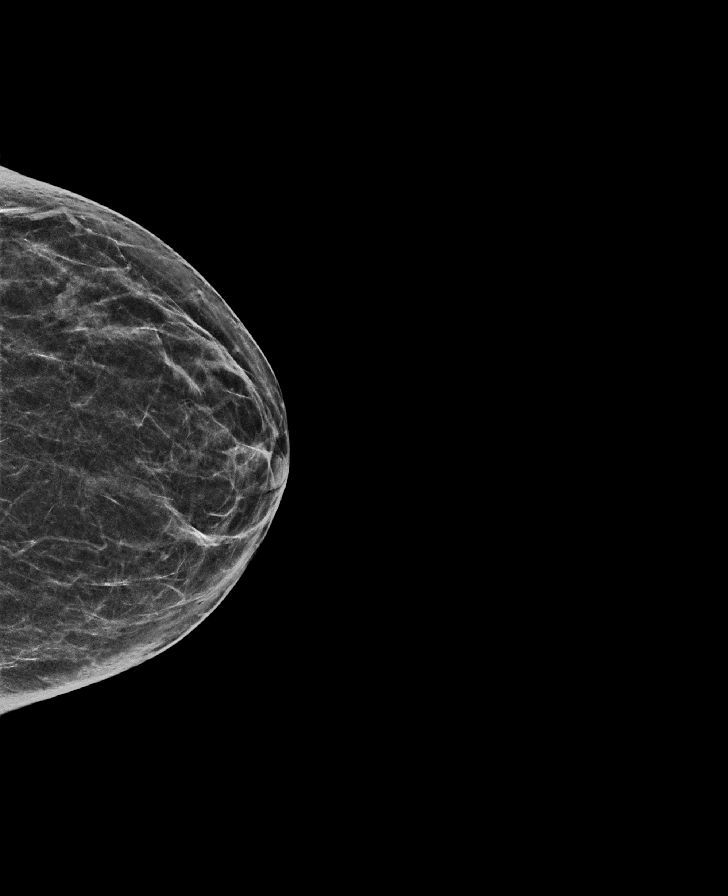

[R CC tomo · tomo slice 27/53.0]
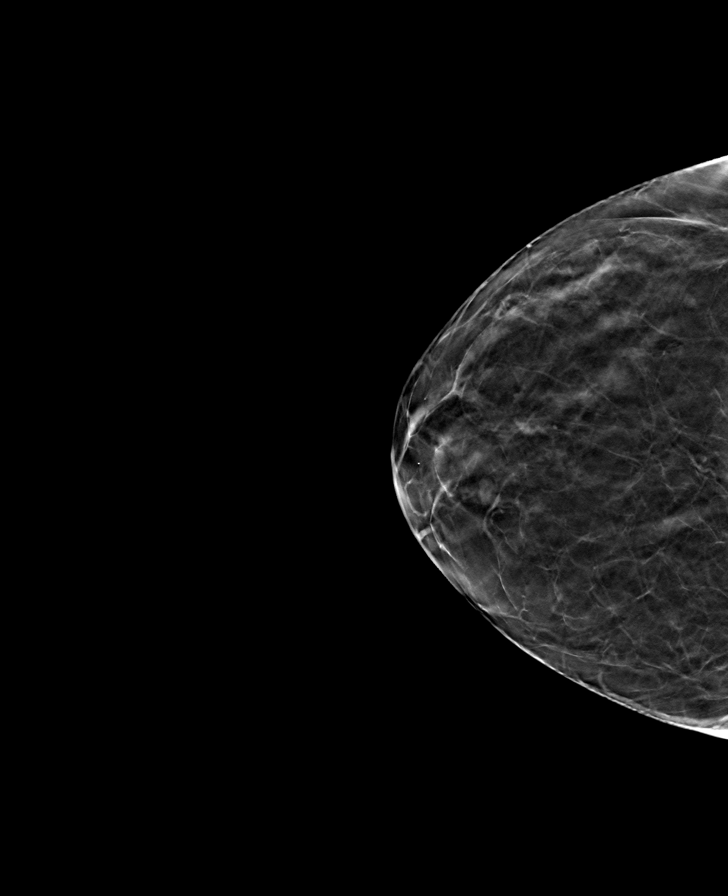

[L CC tomo · tomo slice 29/57.0]
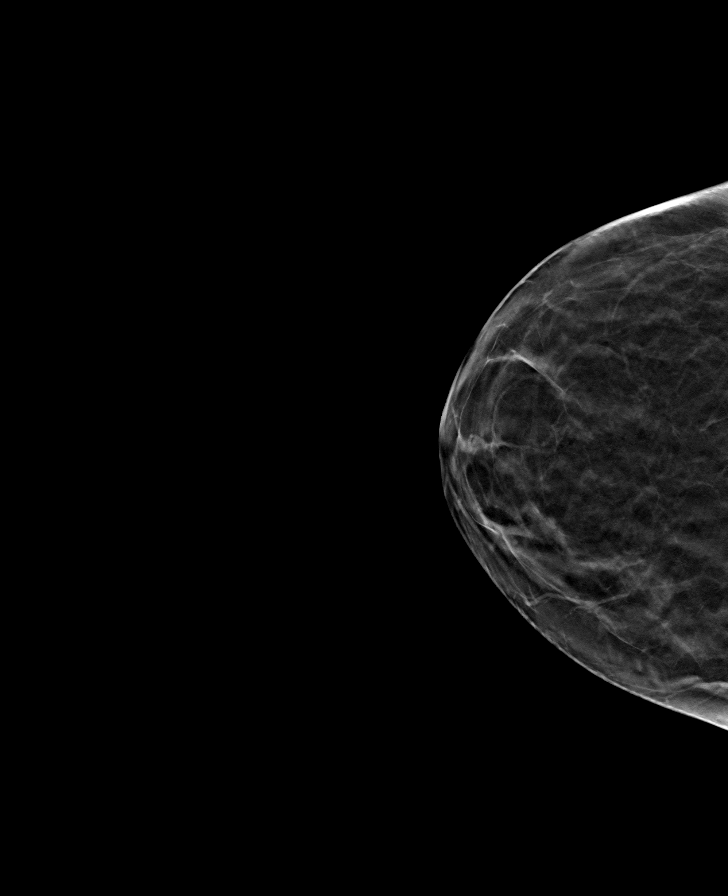

[L MLO tomo · tomo slice 30/59.0]
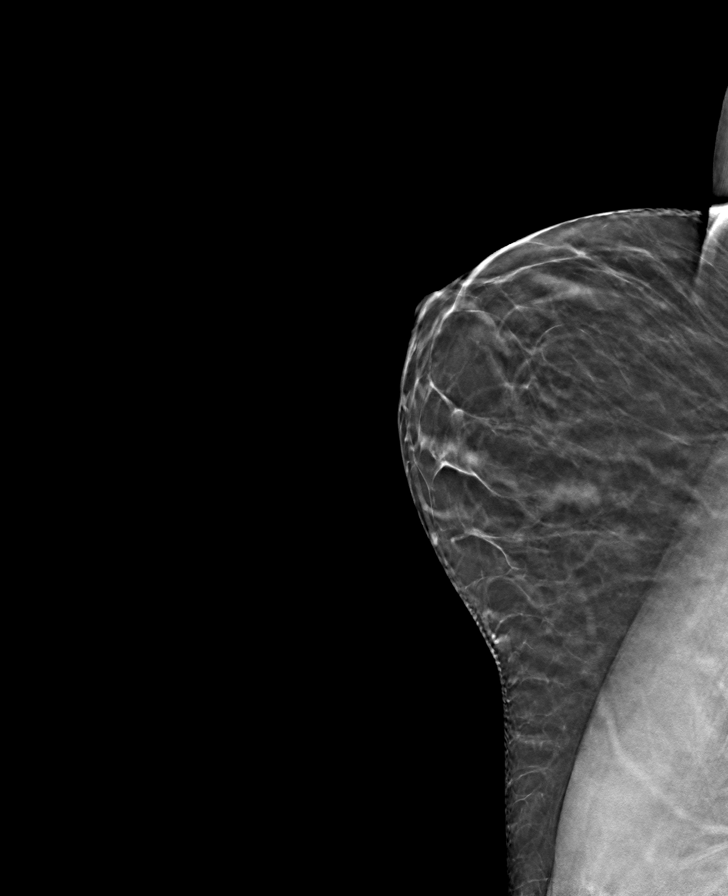

[R MLO tomo · tomo slice 29/57.0]
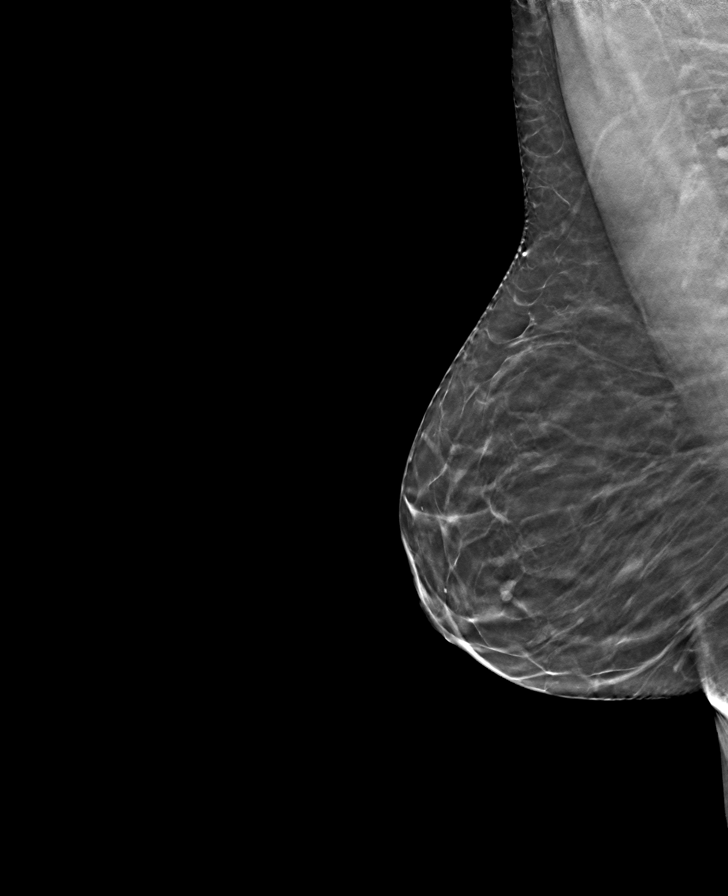

[8 of 24 positions shown; findings below may reference images not displayed]

ACR Breast Density Category b: There are scattered areas of
fibroglandular density.
FINDINGS: In the right breast a possible mass requires further evaluation.

In the left breast a possible asymmetry requires further evaluation.
IMPRESSION: Further evaluation is suggested for possible mass in the right
breast.

Further evaluation is suggested for possible asymmetry in the left
breast.

RECOMMENDATION:
Diagnostic mammogram and possibly ultrasound of both breasts.
(Code:XV-L-MM8)

The patient will be contacted regarding the findings, and additional
imaging will be scheduled.

BI-RADS CATEGORY  0: Incomplete. Need additional imaging evaluation
and/or prior mammograms for comparison.

## 2023-06-30 ENCOUNTER — Encounter: Payer: Self-pay | Admitting: Family Medicine

## 2023-06-30 ENCOUNTER — Other Ambulatory Visit: Payer: Self-pay | Admitting: Family Medicine

## 2023-06-30 DIAGNOSIS — N63 Unspecified lump in unspecified breast: Secondary | ICD-10-CM

## 2023-07-16 ENCOUNTER — Ambulatory Visit
Admission: RE | Admit: 2023-07-16 | Discharge: 2023-07-16 | Disposition: A | Discharge status: Home / Self Care | Payer: Medicaid Other | Source: Ambulatory Visit | Attending: Family Medicine | Admitting: Family Medicine

## 2023-07-16 DIAGNOSIS — N63 Unspecified lump in unspecified breast: Secondary | ICD-10-CM | POA: Diagnosis not present

## 2023-10-15 ENCOUNTER — Other Ambulatory Visit: Payer: Self-pay | Admitting: Family Medicine

## 2023-10-15 DIAGNOSIS — Z1231 Encounter for screening mammogram for malignant neoplasm of breast: Secondary | ICD-10-CM

## 2023-10-19 ENCOUNTER — Ambulatory Visit: Payer: Medicaid Other | Admitting: Dermatology

## 2023-10-19 DIAGNOSIS — L814 Other melanin hyperpigmentation: Secondary | ICD-10-CM

## 2023-10-19 DIAGNOSIS — L720 Epidermal cyst: Secondary | ICD-10-CM

## 2023-10-19 NOTE — Progress Notes (Signed)
   Follow-Up Visit   Subjective  Heather Harrell is a 42 y.o. female who presents for the following: Spot on the left neck x 2 months. No history of bleeding. Is irritating. Patient picks at.   The following portions of the chart were reviewed this encounter and updated as appropriate: medications, allergies, medical history  Review of Systems:  No other skin or systemic complaints except as noted in HPI or Assessment and Plan.  Objective  Well appearing patient in no apparent distress; mood and affect are within normal limits.  A focused examination was performed of the following areas: Face, neck  Relevant physical exam findings are noted in the Assessment and Plan.    Assessment & Plan   MILIA Exam: tiny erythematous firm white papule at the left anterior neck  Discussed this is a type of cyst. Benign-appearing. Sometimes these will clear with OTC adapalene/Differin 0.1% cream QHS or retinol.  Discussed extraction if symptomatic.  Treatment Plan: Symptomatic, irritating, patient would like treated.   Procedure risks and benefits were discussed with the patient including bruising and verbal consent was obtained. Following prep of the skin of the left anterior neck with an alcohol swab, area was injected with 1% lidocaine/epinephrine, extraction of milia was performed with cotton tip applicators following superficial incision made over their surfaces with a #11 surgical blade. Capillary hemostasis was achieved with 20% aluminum chloride solution. Vaseline ointment was applied to each site. The patient tolerated the procedure well.  LENTIGINES Exam: scattered tan macules face Due to sun exposure Treatment Plan: Benign-appearing, observe. Recommend daily broad spectrum sunscreen SPF 30+ to sun-exposed areas, reapply every 2 hours as needed.  Call for any changes   Return if symptoms worsen or fail to improve.  ICherlyn Labella, CMA, am acting as scribe for Willeen Niece, MD  .   Documentation: I have reviewed the above documentation for accuracy and completeness, and I agree with the above.  Willeen Niece, MD

## 2023-10-19 NOTE — Patient Instructions (Signed)

## 2023-12-02 ENCOUNTER — Ambulatory Visit
Admission: RE | Admit: 2023-12-02 | Discharge: 2023-12-02 | Disposition: A | Discharge status: Home / Self Care | Payer: Medicaid Other | Source: Ambulatory Visit | Attending: Family Medicine | Admitting: Family Medicine

## 2023-12-02 DIAGNOSIS — Z1231 Encounter for screening mammogram for malignant neoplasm of breast: Secondary | ICD-10-CM | POA: Insufficient documentation

## 2024-04-25 ENCOUNTER — Ambulatory Visit: Payer: Medicaid Other | Admitting: Dermatology

## 2024-06-07 ENCOUNTER — Ambulatory Visit (INDEPENDENT_AMBULATORY_CARE_PROVIDER_SITE_OTHER): Admitting: Dermatology

## 2024-06-07 DIAGNOSIS — W908XXA Exposure to other nonionizing radiation, initial encounter: Secondary | ICD-10-CM | POA: Diagnosis not present

## 2024-06-07 DIAGNOSIS — D224 Melanocytic nevi of scalp and neck: Secondary | ICD-10-CM

## 2024-06-07 DIAGNOSIS — L700 Acne vulgaris: Secondary | ICD-10-CM | POA: Diagnosis not present

## 2024-06-07 DIAGNOSIS — D229 Melanocytic nevi, unspecified: Secondary | ICD-10-CM

## 2024-06-07 DIAGNOSIS — D2271 Melanocytic nevi of right lower limb, including hip: Secondary | ICD-10-CM

## 2024-06-07 DIAGNOSIS — Z1283 Encounter for screening for malignant neoplasm of skin: Secondary | ICD-10-CM | POA: Diagnosis not present

## 2024-06-07 DIAGNOSIS — D1801 Hemangioma of skin and subcutaneous tissue: Secondary | ICD-10-CM

## 2024-06-07 DIAGNOSIS — D2230 Melanocytic nevi of unspecified part of face: Secondary | ICD-10-CM

## 2024-06-07 DIAGNOSIS — L578 Other skin changes due to chronic exposure to nonionizing radiation: Secondary | ICD-10-CM | POA: Diagnosis not present

## 2024-06-07 DIAGNOSIS — L814 Other melanin hyperpigmentation: Secondary | ICD-10-CM

## 2024-06-07 DIAGNOSIS — D2272 Melanocytic nevi of left lower limb, including hip: Secondary | ICD-10-CM

## 2024-06-07 DIAGNOSIS — L821 Other seborrheic keratosis: Secondary | ICD-10-CM

## 2024-06-07 DIAGNOSIS — D225 Melanocytic nevi of trunk: Secondary | ICD-10-CM

## 2024-06-07 DIAGNOSIS — L918 Other hypertrophic disorders of the skin: Secondary | ICD-10-CM

## 2024-06-07 MED ORDER — CLINDAMYCIN PHOS-BENZOYL PEROX 1.2-5 % EX GEL: 1.0000 | Freq: Every morning | CUTANEOUS | 3 refills | Status: AC

## 2024-06-07 MED ORDER — ADAPALENE 0.3 % EX GEL: 1.0000 | Freq: Every day | CUTANEOUS | 3 refills | Status: AC

## 2024-06-07 NOTE — Progress Notes (Signed)
 Follow-Up Visit   Subjective  Heather Harrell is a 43 y.o. female who presents for the following: Skin Cancer Screening and Full Body Skin Exam, acne face, has tried otc Adapalene  gel 0.1%  The patient presents for Total-Body Skin Exam (TBSE) for skin cancer screening and mole check. The patient has spots, moles and lesions to be evaluated, some may be new or changing and the patient may have concern these could be cancer.    The following portions of the chart were reviewed this encounter and updated as appropriate: medications, allergies, medical history  Review of Systems:  No other skin or systemic complaints except as noted in HPI or Assessment and Plan.  Objective  Well appearing patient in no apparent distress; mood and affect are within normal limits.  A full examination was performed including scalp, head, eyes, ears, nose, lips, neck, chest, axillae, abdomen, back, buttocks, bilateral upper extremities, bilateral lower extremities, hands, feet, fingers, toes, fingernails, and toenails. All findings within normal limits unless otherwise noted below.   Relevant physical exam findings are noted in the Assessment and Plan.    Assessment & Plan   SKIN CANCER SCREENING PERFORMED TODAY.  ACTINIC DAMAGE Chest, arms, legs, face - Chronic condition, secondary to cumulative UV/sun exposure - diffuse scaly erythematous macules with underlying dyspigmentation - Recommend daily broad spectrum sunscreen SPF 30+ to sun-exposed areas, reapply every 2 hours as needed.  - Staying in the shade or wearing long sleeves, sun glasses (UVA+UVB protection) and wide brim hats (4-inch brim around the entire circumference of the hat) are also recommended for sun protection.  - Call for new or changing lesions.  LENTIGINES, SEBORRHEIC KERATOSES, HEMANGIOMAS - Benign normal skin lesions Back,  - Benign-appearing - Call for any changes  MELANOCYTIC NEVI Neck, face - Tan-brown and/or  pink-flesh-colored symmetric macules and papules - L flank 5.0 x 4.40mm brown pap - R flank 4.88mm brown pap - R plantar foot 2.74mm med brown macule - L plantar heel 3.76mm light tan macule - Benign appearing on exam today - Observation - Call clinic for new or changing moles - Recommend daily use of broad spectrum spf 30+ sunscreen to sun-exposed areas.    Acrochordons (Skin Tags) neck - Fleshy, skin-colored pedunculated papules - Benign appearing.  - Observe. - If desired, they can be removed with an in office procedure that is not covered by insurance. - Please call the clinic if you notice any new or changing lesions.   LENTIGO R medial breast Exam: 9.0 x 3.71mm brown macule darker center, no changes per pt  Treatment Plan: Benign-appearing.  Observation.  Call clinic for new or changing moles.  Recommend daily use of broad spectrum spf 30+ sunscreen to sun-exposed areas.   ACNE VULGARIS Face, neck Exam: comedones and resolving inflammatory papules cheeks  Chronic and persistent condition with duration or expected duration over one year. Condition is bothersome/symptomatic for patient. Currently flared.   Treatment Plan: Start Adapalene  0.3% gel qhs to face/neck Start Duac gel qam Samples of moisturizers, sunscreens and acne treatment given to patient.  Patient advised if she can not tolerate her prescription acne medications she can try the otc [place] acne medications  Topical retinoid medications like tretinoin/Retin-A, adapalene /Differin , tazarotene/Fabior, and Epiduo/Epiduo Forte can cause dryness and irritation when first started. Only apply a pea-sized amount to the entire affected area. Avoid applying it around the eyes, edges of mouth and creases at the nose. If you experience irritation, use a good moisturizer  first and/or apply the medicine less often. If you are doing well with the medicine, you can increase how often you use it until you are applying every  night. Be careful with sun protection while using this medication as it can make you sensitive to the sun. This medicine should not be used by pregnant women.   Benzoyl peroxide can cause dryness and irritation of the skin. It can also bleach fabric. When used together with Aczone (dapsone) cream, it can stain the skin orange.    Return in about 3 months (around 09/07/2024) for Acne f/u.  I, Grayce Saunas, RMA, am acting as scribe for Rexene Rattler, MD .   Documentation: I have reviewed the above documentation for accuracy and completeness, and I agree with the above.  Rexene Rattler, MD

## 2024-06-07 NOTE — Patient Instructions (Addendum)

## 2024-06-08 ENCOUNTER — Other Ambulatory Visit: Payer: Self-pay

## 2024-10-03 ENCOUNTER — Ambulatory Visit (INDEPENDENT_AMBULATORY_CARE_PROVIDER_SITE_OTHER): Admitting: Dermatology

## 2024-10-03 DIAGNOSIS — L700 Acne vulgaris: Secondary | ICD-10-CM

## 2024-10-03 DIAGNOSIS — L988 Other specified disorders of the skin and subcutaneous tissue: Secondary | ICD-10-CM | POA: Diagnosis not present

## 2024-10-03 NOTE — Patient Instructions (Signed)

## 2024-10-03 NOTE — Progress Notes (Signed)
   Follow-Up Visit   Subjective  Heather Harrell is a 43 y.o. female who presents for the following: Acne Vulgaris, 4 month follow-up. She is using adapalene  0.3% gel nightly and Duac Gel 3-4 times a week. Acne is improving.   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  The following portions of the chart were reviewed this encounter and updated as appropriate: medications, allergies, medical history  Review of Systems:  No other skin or systemic complaints except as noted in HPI or Assessment and Plan.  Objective  Well appearing patient in no apparent distress; mood and affect are within normal limits.  Areas Examined: Face, chest and back  Relevant exam findings are noted in the Assessment and Plan.   Assessment & Plan    ACNE VULGARIS Exam: cyst at the left chin  Chronic and persistent condition with duration or expected duration over one year. Condition is symptomatic/ bothersome to patient. Not currently at goal.   Treatment Plan: Continue Duac Gel to face daily. Benzoyl peroxide can cause dryness and irritation of the skin. It can also bleach fabric. When used together with Aczone (dapsone) cream, it can stain the skin orange.  Continue Adapalene  0.3% Gel at bedtime as tolerated. Topical retinoid medications like tretinoin/Retin-A, adapalene /Differin , tazarotene/Fabior, and Epiduo/Epiduo Forte can cause dryness and irritation when first started. Only apply a pea-sized amount to the entire affected area. Avoid applying it around the eyes, edges of mouth and creases at the nose. If you experience irritation, use a good moisturizer first and/or apply the medicine less often. If you are doing well with the medicine, you can increase how often you use it until you are applying every night. Be careful with sun protection while using this medication as it can make you sensitive to the sun. This medicine should not be used by pregnant women.   Discussed adding  Spironolactone- pt defers at this time  FACIAL ELASTOSIS Exam: Rhytides and volume loss.  Treatment Plan: Discussed Botox injections, ~25-30 units to glabella and forehead.  Recommend daily broad spectrum sunscreen SPF 30+ to sun-exposed areas, reapply every 2 hours as needed. Call for new or changing lesions.  Staying in the shade or wearing long sleeves, sun glasses (UVA+UVB protection) and wide brim hats (4-inch brim around the entire circumference of the hat) are also recommended for sun protection.    Return for Nov 19 at 1:30PM for Botox.  IAndrea Kerns, CMA, am acting as scribe for Rexene Rattler, MD .   Documentation: I have reviewed the above documentation for accuracy and completeness, and I agree with the above.  Rexene Rattler, MD

## 2024-10-18 ENCOUNTER — Ambulatory Visit: Admitting: Dermatology
